# Patient Record
Sex: Male | Born: 1990 | Race: White | Hispanic: No | Marital: Single | Smoking: Current every day smoker
Health system: Southern US, Community
[De-identification: ages and names within clinical notes are randomized; demographics above are authoritative.]

## PROBLEM LIST (undated history)

## (undated) DIAGNOSIS — F1990 Other psychoactive substance use, unspecified, uncomplicated: Secondary | ICD-10-CM

---
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
## 2015-08-14 ENCOUNTER — Encounter (HOSPITAL_COMMUNITY): Payer: Self-pay | Admitting: Emergency Medicine

## 2015-08-14 ENCOUNTER — Emergency Department (HOSPITAL_COMMUNITY)
Admission: EM | Admit: 2015-08-14 | Discharge: 2015-08-14 | Discharge status: Against Medical Advice | Payer: Self-pay | Attending: Emergency Medicine | Admitting: Emergency Medicine

## 2015-08-14 DIAGNOSIS — Z5321 Procedure and treatment not carried out due to patient leaving prior to being seen by health care provider: Secondary | ICD-10-CM | POA: Insufficient documentation

## 2015-08-14 DIAGNOSIS — Y9389 Activity, other specified: Secondary | ICD-10-CM | POA: Insufficient documentation

## 2015-08-14 DIAGNOSIS — F17210 Nicotine dependence, cigarettes, uncomplicated: Secondary | ICD-10-CM | POA: Insufficient documentation

## 2015-08-14 DIAGNOSIS — Y998 Other external cause status: Secondary | ICD-10-CM | POA: Insufficient documentation

## 2015-08-14 DIAGNOSIS — X58XXXA Exposure to other specified factors, initial encounter: Secondary | ICD-10-CM | POA: Insufficient documentation

## 2015-08-14 DIAGNOSIS — Y9289 Other specified places as the place of occurrence of the external cause: Secondary | ICD-10-CM | POA: Insufficient documentation

## 2015-08-14 NOTE — ED Notes (Signed)
Pt wants to leave AMA.  Pt signed understanding that we advised him not to leave without being seen by MD.

## 2015-08-14 NOTE — ED Provider Notes (Signed)
Patient left without being seen, I did not see or evaluate them.   Seth Brooks Seth Steier, MD 08/14/15 [phone]

## 2015-08-14 NOTE — ED Notes (Signed)
Per EMS pt was found in car overdose unknown amount of heroin.  Pt responds to verbal and painful stimuli.  Pt was ambulatory from EMS stretcher to triage in room.

## 2018-01-13 ENCOUNTER — Encounter (HOSPITAL_COMMUNITY): Payer: Self-pay | Admitting: Emergency Medicine

## 2018-01-13 ENCOUNTER — Emergency Department (HOSPITAL_COMMUNITY)
Admission: EM | Admit: 2018-01-13 | Discharge: 2018-01-13 | Disposition: A | Discharge status: Home / Self Care | Payer: Self-pay | Attending: Emergency Medicine | Admitting: Emergency Medicine

## 2018-01-13 DIAGNOSIS — M7989 Other specified soft tissue disorders: Secondary | ICD-10-CM

## 2018-01-13 DIAGNOSIS — R509 Fever, unspecified: Secondary | ICD-10-CM | POA: Insufficient documentation

## 2018-01-13 DIAGNOSIS — R600 Localized edema: Secondary | ICD-10-CM | POA: Insufficient documentation

## 2018-01-13 DIAGNOSIS — F17210 Nicotine dependence, cigarettes, uncomplicated: Secondary | ICD-10-CM | POA: Insufficient documentation

## 2018-01-13 DIAGNOSIS — M79605 Pain in left leg: Secondary | ICD-10-CM

## 2018-01-13 DIAGNOSIS — M79662 Pain in left lower leg: Secondary | ICD-10-CM | POA: Insufficient documentation

## 2018-01-13 LAB — COMPREHENSIVE METABOLIC PANEL
ALT: 21 U/L (ref 17–63)
AST: 31 U/L (ref 15–41)
Albumin: 3.9 g/dL (ref 3.5–5.0)
Alkaline Phosphatase: 67 U/L (ref 38–126)
Anion gap: 9 (ref 5–15)
BUN: 17 mg/dL (ref 6–20)
CO2: 25 mmol/L (ref 22–32)
Calcium: 8.7 mg/dL — ABNORMAL LOW (ref 8.9–10.3)
Chloride: 98 mmol/L — ABNORMAL LOW (ref 101–111)
Creatinine, Ser: 1.21 mg/dL (ref 0.61–1.24)
GFR calc Af Amer: >60 mL/min (ref >60)
GFR calc non Af Amer: >60 mL/min (ref >60)
Glucose, Bld: 106 mg/dL — ABNORMAL HIGH (ref 65–99)
Potassium: 3.8 mmol/L (ref 3.5–5.1)
Sodium: 132 mmol/L — ABNORMAL LOW (ref 135–145)
Total Bilirubin: 0.8 mg/dL (ref 0.3–1.2)
Total Protein: 7.5 g/dL (ref 6.5–8.1)

## 2018-01-13 LAB — CBC WITH DIFFERENTIAL/PLATELET
Basophils Absolute: 0 10*3/uL (ref 0.0–0.1)
Basophils Relative: 0 %
Eosinophils Absolute: 0 10*3/uL (ref 0.0–0.7)
Eosinophils Relative: 0 %
HCT: 37.8 % — ABNORMAL LOW (ref 39.0–52.0)
Hemoglobin: 13 g/dL (ref 13.0–17.0)
Lymphocytes Relative: 11 %
Lymphs Abs: 0.9 10*3/uL (ref 0.7–4.0)
MCH: 28 pg (ref 26.0–34.0)
MCHC: 34.4 g/dL (ref 30.0–36.0)
MCV: 81.5 fL (ref 78.0–100.0)
Monocytes Absolute: 1.2 10*3/uL — ABNORMAL HIGH (ref 0.1–1.0)
Monocytes Relative: 14 %
Neutro Abs: 6.5 10*3/uL (ref 1.7–7.7)
Neutrophils Relative %: 75 %
Platelets: 198 10*3/uL (ref 150–400)
RBC: 4.64 MIL/uL (ref 4.22–5.81)
RDW: 13 % (ref 11.5–15.5)
WBC: 8.7 10*3/uL (ref 4.0–10.5)

## 2018-01-13 LAB — URINALYSIS, ROUTINE W REFLEX MICROSCOPIC
BILIRUBIN URINE: NEGATIVE
Bacteria, UA: NONE SEEN
GLUCOSE, UA: NEGATIVE mg/dL
KETONES UR: NEGATIVE mg/dL
LEUKOCYTES UA: NEGATIVE
Nitrite: NEGATIVE
Protein, ur: NEGATIVE mg/dL
Specific Gravity, Urine: 1.026 (ref 1.005–1.030)
pH: 5 (ref 5.0–8.0)

## 2018-01-13 LAB — I-STAT CG4 LACTIC ACID, ED: Lactic Acid, Venous: 1.07 mmol/L (ref 0.5–1.9)

## 2018-01-13 MED ORDER — SODIUM CHLORIDE 0.9 % IV BOLUS
1000.0000 mL | Freq: Once | INTRAVENOUS | Status: AC
  Administered 2018-01-13: 1000 mL via INTRAVENOUS

## 2018-01-13 MED ORDER — CLINDAMYCIN PHOSPHATE 600 MG/50ML IV SOLN
600.0000 mg | Freq: Once | INTRAVENOUS | Status: AC
  Administered 2018-01-13: 600 mg via INTRAVENOUS
  Filled 2018-01-13: qty 50

## 2018-01-13 MED ORDER — CLINDAMYCIN HCL 150 MG PO CAPS: 450.0000 mg | ORAL_CAPSULE | Freq: Three times a day (TID) | ORAL | 0 refills | Status: DC

## 2018-01-13 MED ORDER — OXYCODONE-ACETAMINOPHEN 5-325 MG PO TABS: 1.0000 | ORAL_TABLET | ORAL | 0 refills | Status: DC | PRN

## 2018-01-13 MED ORDER — CLINDAMYCIN HCL 150 MG PO CAPS: 450.0000 mg | ORAL_CAPSULE | Freq: Three times a day (TID) | ORAL | 0 refills | Status: AC

## 2018-01-13 MED ORDER — ACETAMINOPHEN 325 MG PO TABS
650.0000 mg | ORAL_TABLET | Freq: Once | ORAL | Status: AC | PRN
  Administered 2018-01-13: 650 mg via ORAL
  Filled 2018-01-13: qty 2

## 2018-01-13 MED ORDER — MORPHINE SULFATE (PF) 4 MG/ML IV SOLN
4.0000 mg | Freq: Once | INTRAVENOUS | Status: AC
  Administered 2018-01-13: 4 mg via INTRAVENOUS
  Filled 2018-01-13: qty 1

## 2018-01-13 NOTE — ED Notes (Signed)
RN made aware of abnormal vital signs.  

## 2018-01-13 NOTE — ED Triage Notes (Signed)
Patient c/o redness, warmth, swelling, and pain to left lower extremity since Saturday. Denies wound, injury. Febrile in triage. Pulse present in triage.

## 2018-01-13 NOTE — ED Provider Notes (Addendum)
[facility]-EMERGENCY DEPT Provider Note   CSN: [id number] Arrival date & time: 01/13/18  1745     History   Chief Complaint Chief Complaint  Patient presents with  . Fever  . Leg Pain    HPI Seth Brooks is a 27 y.o. male with no past medical history is here for gradually worsening redness to left lower extremity for 4 days.  Symptoms began in the lower leg and have been spreading up towards the knee.  Associated symptoms include swelling, redness, warmth, pain with ambulation, fevers.  Pain is moderate.  Aggravating factors include putting weight on the leg.  No alleviating factors.  No interventions PTA.  No trauma.  No history of DVT, PE, prolonged travel, recent immobilization, recent surgery, malignancy, estrogen use.  He denies chest pain, shortness of breath, pleuritic chest pain  HPI  History reviewed. No pertinent past medical history.  There are no active problems to display for this patient.   History reviewed. No pertinent surgical history.      Home Medications    Prior to Admission medications   Medication Sig Start Date End Date Taking? Authorizing Provider  clindamycin (CLEOCIN) 150 MG capsule Take 3 capsules (450 mg total) by mouth 3 (three) times daily for 5 days. 01/13/18 01/18/18  Liberty Handy, PA-C  oxyCODONE-acetaminophen (PERCOCET/ROXICET) 5-325 MG tablet Take 1 tablet by mouth every 4 (four) hours as needed for up to 3 days for severe pain. 01/13/18 01/16/18  Liberty Handy, PA-C    Family History No family history on file.  Social History Social History   Tobacco Use  . Smoking status: Light Tobacco Smoker    Types: Cigarettes  Substance Use Topics  . Alcohol use: No  . Drug use: Not on file     Allergies   Patient has no known allergies.   Review of Systems Review of Systems  Constitutional: Positive for fever.  Cardiovascular: Positive for leg swelling.  Skin: Positive for color change.  All other  systems reviewed and are negative.    Physical Exam Updated Vital Signs BP 114/82   Pulse (!) 101   Temp 100.2 F (37.9 C) (Oral)   Resp 14   Ht  (1.854 m)   Wt 95.3 kg (210 lb)   SpO2 99%   BMI 27.71 kg/m   Physical Exam  Constitutional: He is oriented to person, place, and time. He appears well-developed and well-nourished. No distress.  Non toxic.  HENT:  Head: Normocephalic and atraumatic.  Nose: Nose normal.  Mouth/Throat: No oropharyngeal exudate.  Moist mucous membranes   Eyes: Pupils are equal, round, and reactive to light. Conjunctivae and EOM are normal.  Neck: Normal range of motion.  Cardiovascular: Normal rate, regular rhythm, normal heart sounds and intact distal pulses.  No murmur heard. 2+ pitting edema to the left lower extremity up to knee.  Mild tenderness to the left calf.  No edema, tenderness to the right lower extremity.  2+ DP and radial pulses bilaterally.  Pulmonary/Chest: Effort normal and breath sounds normal.  Abdominal: Soft. Bowel sounds are normal. There is no tenderness.  No G/R/R. No suprapubic or CVA tenderness.   Musculoskeletal: Normal range of motion. He exhibits no deformity.  No erythema, edema, warm to joints of the left lower extremity.  He has full active range of motion of these joints without pain.  Neurological: He is alert and oriented to person, place, and time.  Skin: Skin is warm and  dry. Capillary refill takes less than 2 seconds.  Erythema to left lower extremity, most significant above ankle spreading up towards the left knee.  Tender.  No warmth.  There is a tiny linear abrasion to the mid tib/fib.  Psychiatric: He has a normal mood and affect. His behavior is normal. Judgment and thought content normal.  Nursing note and vitals reviewed.        ED Treatments / Results  Labs (all labs ordered are listed, but only abnormal results are displayed) Labs Reviewed  COMPREHENSIVE METABOLIC PANEL - Abnormal; Notable  for the following components:      Result Value   Sodium 132 (*)    Chloride 98 (*)    Glucose, Bld 106 (*)    Calcium 8.7 (*)    All other components within normal limits  CBC WITH DIFFERENTIAL/PLATELET - Abnormal; Notable for the following components:   HCT 37.8 (*)    Monocytes Absolute 1.2 (*)    All other components within normal limits  URINALYSIS, ROUTINE W REFLEX MICROSCOPIC - Abnormal; Notable for the following components:   Hgb urine dipstick SMALL (*)    All other components within normal limits  CULTURE, BLOOD (ROUTINE X 2)  CULTURE, BLOOD (ROUTINE X 2)  I-STAT CG4 LACTIC ACID, ED  I-STAT CG4 LACTIC ACID, ED    EKG EKG Interpretation  Date/Time:  Tuesday January 13 2018 19:10:09 EDT Ventricular Rate:  100 PR Interval:    QRS Duration: 89 QT Interval:  338 QTC Calculation: 436 R Axis:   83 Text Interpretation:  Sinus tachycardia No prior ECG for comparison.  No STEMI Confirmed by Theda Belfast ([id number]) on 01/13/2018 7:52:59 PM   Radiology No results found.  Procedures Procedures (including critical care time)  Medications Ordered in ED Medications  acetaminophen (TYLENOL) tablet 650 mg (650 mg Oral Given 01/13/18 1821)  sodium chloride 0.9 % bolus 1,000 mL (1,000 mLs Intravenous New Bag/Given 01/13/18 1905)  clindamycin (CLEOCIN) IVPB 600 mg (600 mg Intravenous New Bag/Given 01/13/18 1948)  morphine 4 MG/ML injection 4 mg (4 mg Intravenous Given 01/13/18 1922)     Initial Impression / Assessment and Plan / ED Course  I have reviewed the triage vital signs and the nursing notes.  Pertinent labs & imaging results that were available during my care of the patient were reviewed by me and considered in my medical decision making (see chart for details).     Differential diagnosis includes DVT, cellulitis.  Given his fever, appearance on exam cellulitis is favored.  He has no risk factors for DVT.  There are no signs of septic arthritis.  No IVDU. Extremities  neurovascularly intact.  Initial vital signs remarkable for fever and tachycardia, this resolved after antipyretics.  He does not look toxic or septic.  No history of immunocompromise. PE considered given initial tachycardia and fever however he is asymptomatic w/o CP, SOB, tachypnea, hypoxia.   Lab work reviewed and reassuring.  Normal WBC and lactic acid.  Unfortunately, vascular ultrasound is not available in the ED.  Patient is given IV fluids, clindamycin, analgesia in the ED.  And given reassuring exam  Normal labs, immunocompetent status feel patient is adequate for discharge with antibiotics, pain control, crutches.  Although DVT low on differential, will recommend vascular ultrasound tomorrow morning to rule this out.  Order placed for vascular ultrasound at [facility]. Discussed return precautions. Patient verbalized understanding and is in agreement of ED treatment and discharge plan.  Patient discussed with Dr. Rush Landmark.  Final Clinical Impressions(s) / ED Diagnoses   Final diagnoses:  Pain and swelling of lower extremity, left    ED Discharge Orders        Ordered    clindamycin (CLEOCIN) 150 MG capsule  3 times daily     01/13/18 2035    oxyCODONE-acetaminophen (PERCOCET/ROXICET) 5-325 MG tablet  Every 4 hours PRN     01/13/18 2035    LE VENOUS     01/13/18 2039         Liberty Handy, PA-C 01/13/18 2040    Tegeler, Canary Brim, MD 01/14/18 0000

## 2018-01-13 NOTE — ED Notes (Signed)
Bed: WA09 Expected date:  Expected time:  Means of arrival:  Comments: [place]

## 2018-01-13 NOTE — Discharge Instructions (Addendum)
Symptoms most likely from cellulitis which is a superficial skin infection.  A blood clot in your leg it can also present similarly.  We have low suspicion for a blood clot and we will treat this like cellulitis.  However, we recommend you go to [facility] radiology/vascular ultrasound department tomorrow at 8 AM to have a vascular ultrasound of your left lower extremity to rule out a blood clot.  Return for chest pain, shortness of breath, pain in your chest with deep inspiration, palpitations, dizziness, bloody cough.   Take antibiotic as prescribed. For pain can take 1000 mg acetaminophen every 6 hours. Can add 600 mg ibuprofen for more pain control.   IMPORTANT PATIENT INSTRUCTIONS:  You have been scheduled for an Outpatient Vascular Study at [facility].    If tomorrow is a Saturday, Sunday or holiday, please go to the [facility] Emergency Department Registration Desk at 8 am tomorrow morning and tell them you are there for a vascular study.  If tomorrow is a weekday (Monday-Friday), please go to [facility] Admitting Department at 8 am and tell them you are there for a vascular study.

## 2018-01-13 NOTE — ED Notes (Signed)
Ortho tech paged for crutches.  

## 2018-01-14 ENCOUNTER — Ambulatory Visit (HOSPITAL_COMMUNITY): Payer: Self-pay

## 2018-01-14 ENCOUNTER — Ambulatory Visit (HOSPITAL_COMMUNITY)
Admission: RE | Admit: 2018-01-14 | Discharge: 2018-01-14 | Disposition: A | Discharge status: Home / Self Care | Payer: Self-pay | Source: Ambulatory Visit | Attending: Emergency Medicine | Admitting: Emergency Medicine

## 2018-01-14 DIAGNOSIS — M7989 Other specified soft tissue disorders: Secondary | ICD-10-CM | POA: Insufficient documentation

## 2018-01-14 NOTE — Progress Notes (Signed)
Left lower extremity venous duplex has been completed. Negative for DVT.  01/14/18 2:23 PM Olen Cordial RVT

## 2018-01-18 LAB — CULTURE, BLOOD (ROUTINE X 2)
CULTURE: NO GROWTH
Culture: NO GROWTH
SPECIAL REQUESTS: ADEQUATE
SPECIAL REQUESTS: ADEQUATE

## 2019-08-12 ENCOUNTER — Other Ambulatory Visit: Payer: Self-pay

## 2019-08-12 ENCOUNTER — Inpatient Hospital Stay (HOSPITAL_COMMUNITY)
Admission: EM | Admit: 2019-08-12 | Discharge: 2019-08-13 | DRG: 872 | Discharge status: Against Medical Advice | Payer: Self-pay | Attending: Student | Admitting: Student

## 2019-08-12 ENCOUNTER — Encounter (HOSPITAL_COMMUNITY): Payer: Self-pay | Admitting: Emergency Medicine

## 2019-08-12 DIAGNOSIS — A400 Sepsis due to streptococcus, group A: Principal | ICD-10-CM | POA: Diagnosis present

## 2019-08-12 DIAGNOSIS — F1510 Other stimulant abuse, uncomplicated: Secondary | ICD-10-CM | POA: Diagnosis present

## 2019-08-12 DIAGNOSIS — E878 Other disorders of electrolyte and fluid balance, not elsewhere classified: Secondary | ICD-10-CM | POA: Diagnosis present

## 2019-08-12 DIAGNOSIS — L0390 Cellulitis, unspecified: Secondary | ICD-10-CM | POA: Diagnosis present

## 2019-08-12 DIAGNOSIS — E876 Hypokalemia: Secondary | ICD-10-CM | POA: Diagnosis present

## 2019-08-12 DIAGNOSIS — L03116 Cellulitis of left lower limb: Secondary | ICD-10-CM | POA: Diagnosis present

## 2019-08-12 DIAGNOSIS — Z6824 Body mass index (BMI) 24.0-24.9, adult: Secondary | ICD-10-CM

## 2019-08-12 DIAGNOSIS — L03114 Cellulitis of left upper limb: Secondary | ICD-10-CM

## 2019-08-12 DIAGNOSIS — F1110 Opioid abuse, uncomplicated: Secondary | ICD-10-CM | POA: Diagnosis present

## 2019-08-12 DIAGNOSIS — Z590 Homelessness: Secondary | ICD-10-CM

## 2019-08-12 DIAGNOSIS — R6520 Severe sepsis without septic shock: Secondary | ICD-10-CM

## 2019-08-12 DIAGNOSIS — M7989 Other specified soft tissue disorders: Secondary | ICD-10-CM

## 2019-08-12 DIAGNOSIS — E860 Dehydration: Secondary | ICD-10-CM | POA: Diagnosis present

## 2019-08-12 DIAGNOSIS — E871 Hypo-osmolality and hyponatremia: Secondary | ICD-10-CM | POA: Diagnosis present

## 2019-08-12 DIAGNOSIS — F17210 Nicotine dependence, cigarettes, uncomplicated: Secondary | ICD-10-CM | POA: Diagnosis present

## 2019-08-12 DIAGNOSIS — A419 Sepsis, unspecified organism: Secondary | ICD-10-CM | POA: Diagnosis present

## 2019-08-12 DIAGNOSIS — Z5329 Procedure and treatment not carried out because of patient's decision for other reasons: Secondary | ICD-10-CM | POA: Diagnosis not present

## 2019-08-12 DIAGNOSIS — E440 Moderate protein-calorie malnutrition: Secondary | ICD-10-CM

## 2019-08-12 DIAGNOSIS — E861 Hypovolemia: Secondary | ICD-10-CM | POA: Diagnosis present

## 2019-08-12 DIAGNOSIS — Z20828 Contact with and (suspected) exposure to other viral communicable diseases: Secondary | ICD-10-CM | POA: Diagnosis present

## 2019-08-12 HISTORY — DX: Other psychoactive substance use, unspecified, uncomplicated: F19.90

## 2019-08-12 LAB — CBC WITH DIFFERENTIAL/PLATELET
Abs Immature Granulocytes: 0.07 10*3/uL (ref 0.00–0.07)
Basophils Absolute: 0 10*3/uL (ref 0.0–0.1)
Basophils Relative: 0 %
Eosinophils Absolute: 0 10*3/uL (ref 0.0–0.5)
Eosinophils Relative: 0 %
HCT: 40.9 % (ref 39.0–52.0)
Hemoglobin: 13.6 g/dL (ref 13.0–17.0)
Immature Granulocytes: 1 %
Lymphocytes Relative: 6 %
Lymphs Abs: 0.7 10*3/uL (ref 0.7–4.0)
MCH: 25.8 pg — ABNORMAL LOW (ref 26.0–34.0)
MCHC: 33.3 g/dL (ref 30.0–36.0)
MCV: 77.5 fL — ABNORMAL LOW (ref 80.0–100.0)
Monocytes Absolute: 0.9 10*3/uL (ref 0.1–1.0)
Monocytes Relative: 7 %
Neutro Abs: 11.4 10*3/uL — ABNORMAL HIGH (ref 1.7–7.7)
Neutrophils Relative %: 86 %
Platelets: 158 10*3/uL (ref 150–400)
RBC: 5.28 MIL/uL (ref 4.22–5.81)
RDW: 14.4 % (ref 11.5–15.5)
WBC: 13.2 10*3/uL — ABNORMAL HIGH (ref 4.0–10.5)
nRBC: 0 % (ref 0.0–0.2)

## 2019-08-12 LAB — COMPREHENSIVE METABOLIC PANEL
ALT: 18 U/L (ref 0–44)
AST: 28 U/L (ref 15–41)
Albumin: 3.1 g/dL — ABNORMAL LOW (ref 3.5–5.0)
Alkaline Phosphatase: 107 U/L (ref 38–126)
Anion gap: 14 (ref 5–15)
BUN: 8 mg/dL (ref 6–20)
CO2: 22 mmol/L (ref 22–32)
Calcium: 9 mg/dL (ref 8.9–10.3)
Chloride: 89 mmol/L — ABNORMAL LOW (ref 98–111)
Creatinine, Ser: 0.95 mg/dL (ref 0.61–1.24)
GFR calc Af Amer: >60 mL/min (ref >60)
GFR calc non Af Amer: >60 mL/min (ref >60)
Glucose, Bld: 135 mg/dL — ABNORMAL HIGH (ref 70–99)
Potassium: 3.4 mmol/L — ABNORMAL LOW (ref 3.5–5.1)
Sodium: 125 mmol/L — ABNORMAL LOW (ref 135–145)
Total Bilirubin: 1.1 mg/dL (ref 0.3–1.2)
Total Protein: 7.7 g/dL (ref 6.5–8.1)

## 2019-08-12 LAB — LACTIC ACID, PLASMA: Lactic Acid, Venous: 3 mmol/L (ref 0.5–1.9)

## 2019-08-12 MED ORDER — VANCOMYCIN HCL IN DEXTROSE 1-5 GM/200ML-% IV SOLN
1000.0000 mg | Freq: Once | INTRAVENOUS | Status: AC
  Administered 2019-08-13: 01:00:00 1000 mg via INTRAVENOUS
  Filled 2019-08-12: qty 200

## 2019-08-12 MED ORDER — KETOROLAC TROMETHAMINE 30 MG/ML IJ SOLN
30.0000 mg | Freq: Once | INTRAMUSCULAR | Status: AC
Start: 2019-08-13 — End: 2019-08-13
  Administered 2019-08-13: 30 mg via INTRAVENOUS
  Filled 2019-08-12: qty 1

## 2019-08-12 MED ORDER — SODIUM CHLORIDE 0.9 % IV BOLUS
1000.0000 mL | Freq: Once | INTRAVENOUS | Status: AC
  Administered 2019-08-13: 1000 mL via INTRAVENOUS

## 2019-08-12 NOTE — ED Triage Notes (Signed)
Patient presents with left forearm redness/swelling onset this week infected from multiple IV drug sticks , denies fever or chills , respirations unlabored.

## 2019-08-12 NOTE — ED Provider Notes (Signed)
[facility] EMERGENCY DEPARTMENT Provider Note   CSN: [id number] Arrival date & time: 08/12/19  1919     History   Chief Complaint Chief Complaint  Patient presents with  . Left Arm Cellulitis    IV drug user    HPI Seth Brooks is a 28 y.o. male.     HPI  This is a 28 year old male with a history of IV drug abuse who presents with left arm pain and swelling.  Patient reports several day history of left arm pain and swelling.  He rates his pain at 10 out of 10.  He is a current IV drug user and injects heroin mostly in his left arm.  He is not noted any fevers at home.  He is not taking anything for his pain.  Reports pain is much worse with palpation and extension of the arm.  Denies numbness or tingling.  Denies chest pain, shortness of breath, fevers at home.  He states "I think I have a blood infection."  Past Medical History:  Diagnosis Date  . IV drug user     There are no active problems to display for this patient.   History reviewed. No pertinent surgical history.      Home Medications    Prior to Admission medications   Not on File    Family History No family history on file.  Social History Social History   Tobacco Use  . Smoking status: Light Tobacco Smoker    Types: Cigarettes  Substance Use Topics  . Alcohol use: No  . Drug use: Yes    Types: Methamphetamines, IV    Comment: Heroin      Allergies   Patient has no known allergies.   Review of Systems Review of Systems  Constitutional: Negative for fever.  Respiratory: Negative for shortness of breath.   Cardiovascular: Negative for chest pain.  Gastrointestinal: Negative for abdominal pain.  Genitourinary: Negative for dysuria.  Musculoskeletal: Negative for back pain.       Left arm pain  Skin: Positive for color change. Negative for wound.  All other systems reviewed and are negative.    Physical Exam Updated Vital Signs BP 133/86 (BP Location: Right  Arm)   Pulse (!) 120   Temp 97.9 F (36.6 C) (Oral)   Resp 20   SpO2 100%   Physical Exam Vitals signs and nursing note reviewed.  Constitutional:      Appearance: He is well-developed.     Comments: Uncomfortable appearing but nontoxic  HENT:     Head: Normocephalic and atraumatic.     Mouth/Throat:     Mouth: Mucous membranes are moist.  Eyes:     Pupils: Pupils are equal, round, and reactive to light.  Neck:     Musculoskeletal: Neck supple.  Cardiovascular:     Rate and Rhythm: Normal rate and regular rhythm.     Heart sounds: Normal heart sounds.  Pulmonary:     Effort: Pulmonary effort is normal. No respiratory distress.     Breath sounds: Normal breath sounds.  Abdominal:     Palpations: Abdomen is soft.     Tenderness: There is no abdominal tenderness.  Musculoskeletal:     Comments: Left upper extremity in a flexed position, swelling noted of the forearm into the hand, 1+ radial pulse, there is a palpable cord, no fluctuance or localized induration  Skin:    General: Skin is warm and dry.     Findings: Erythema  present.  Neurological:     Mental Status: He is alert and oriented to person, place, and time.  Psychiatric:        Mood and Affect: Mood normal.      ED Treatments / Results  Labs (all labs ordered are listed, but only abnormal results are displayed) Labs Reviewed  CBC WITH DIFFERENTIAL/PLATELET - Abnormal; Notable for the following components:      Result Value   WBC 13.2 (*)    MCV 77.5 (*)    MCH 25.8 (*)    Neutro Abs 11.4 (*)    All other components within normal limits  COMPREHENSIVE METABOLIC PANEL - Abnormal; Notable for the following components:   Sodium 125 (*)    Potassium 3.4 (*)    Chloride 89 (*)    Glucose, Bld 135 (*)    Albumin 3.1 (*)    All other components within normal limits  LACTIC ACID, PLASMA - Abnormal; Notable for the following components:   Lactic Acid, Venous 3.0 (*)    All other components within normal  limits  CULTURE, BLOOD (ROUTINE X 2)  CULTURE, BLOOD (ROUTINE X 2)  SARS CORONAVIRUS 2 (TAT 6-24 HRS)  LACTIC ACID, PLASMA    EKG EKG Interpretation  Date/Time:  Thursday August 12 2019 19:31:28 EST Ventricular Rate:  126 PR Interval:  132 QRS Duration: 94 QT Interval:  296 QTC Calculation: 428 R Axis:   81 Text Interpretation: Sinus tachycardia Right atrial enlargement Borderline ECG Confirmed by Tilden Fossaees, Elizabeth [phone]([id number]) on 08/12/2019 9:29:22 PM   Radiology No results found.  Procedures Procedures (including critical care time)  CRITICAL CARE Performed by: Shon Batonourtney F Horton   Total critical care time: 45 minutes  Critical care time was exclusive of separately billable procedures and treating other patients.  Critical care was necessary to treat or prevent imminent or life-threatening deterioration.  Critical care was time spent personally by me on the following activities: development of treatment plan with patient and/or surrogate as well as nursing, discussions with consultants, evaluation of patient's response to treatment, examination of patient, obtaining history from patient or surrogate, ordering and performing treatments and interventions, ordering and review of laboratory studies, ordering and review of radiographic studies, pulse oximetry and re-evaluation of patient's condition.  Medications Ordered in ED Medications  sodium chloride 0.9 % bolus 1,000 mL (has no administration in time range)  vancomycin (VANCOCIN) IVPB 1000 mg/200 mL premix (has no administration in time range)  ketorolac (TORADOL) 30 MG/ML injection 30 mg (has no administration in time range)     Initial Impression / Assessment and Plan / ED Course  I have reviewed the triage vital signs and the nursing notes.  Pertinent labs & imaging results that were available during my care of the patient were reviewed by me and considered in my medical decision making (see chart for details).         Patient presents with pain and erythema of the left upper extremity.  History of IV drug abuse.  He has at minimum extensive cellulitis.  He is difficult to examine as he is in pain and holding his arm in a flexed position.  At this time I do not appreciate localized fluctuance or induration to suggest abscess although certainly this is a possibility.  We will give him pain medication, fluids, antibiotics.  Lab work-up notable for lactate of 3.0.  White count of 13.2.  Sodium 125.  He appears to be in severe sepsis secondary to cellulitis.  Will admit for antibiotics and further evaluation.  Final Clinical Impressions(s) / ED Diagnoses   Final diagnoses:  Cellulitis of left upper extremity  Severe sepsis [facility])  Hyponatremia    ED Discharge Orders    None       Merryl Hacker, MD 08/13/19 [phone]

## 2019-08-13 ENCOUNTER — Inpatient Hospital Stay (HOSPITAL_COMMUNITY): Payer: Self-pay

## 2019-08-13 ENCOUNTER — Encounter (HOSPITAL_COMMUNITY): Payer: Self-pay | Admitting: Internal Medicine

## 2019-08-13 DIAGNOSIS — E440 Moderate protein-calorie malnutrition: Secondary | ICD-10-CM

## 2019-08-13 DIAGNOSIS — F1920 Other psychoactive substance dependence, uncomplicated: Secondary | ICD-10-CM

## 2019-08-13 DIAGNOSIS — R609 Edema, unspecified: Secondary | ICD-10-CM

## 2019-08-13 DIAGNOSIS — E876 Hypokalemia: Secondary | ICD-10-CM | POA: Diagnosis present

## 2019-08-13 DIAGNOSIS — A419 Sepsis, unspecified organism: Secondary | ICD-10-CM | POA: Diagnosis present

## 2019-08-13 DIAGNOSIS — L03116 Cellulitis of left lower limb: Secondary | ICD-10-CM

## 2019-08-13 DIAGNOSIS — R000 Tachycardia, unspecified: Secondary | ICD-10-CM

## 2019-08-13 DIAGNOSIS — L0390 Cellulitis, unspecified: Secondary | ICD-10-CM | POA: Diagnosis present

## 2019-08-13 DIAGNOSIS — F1120 Opioid dependence, uncomplicated: Secondary | ICD-10-CM

## 2019-08-13 DIAGNOSIS — E871 Hypo-osmolality and hyponatremia: Secondary | ICD-10-CM | POA: Diagnosis present

## 2019-08-13 DIAGNOSIS — L03114 Cellulitis of left upper limb: Secondary | ICD-10-CM

## 2019-08-13 LAB — COMPREHENSIVE METABOLIC PANEL
ALT: 15 U/L (ref 0–44)
AST: 17 U/L (ref 15–41)
Albumin: 2.2 g/dL — ABNORMAL LOW (ref 3.5–5.0)
Alkaline Phosphatase: 79 U/L (ref 38–126)
Anion gap: 12 (ref 5–15)
BUN: 11 mg/dL (ref 6–20)
CO2: 24 mmol/L (ref 22–32)
Calcium: 8.3 mg/dL — ABNORMAL LOW (ref 8.9–10.3)
Chloride: 94 mmol/L — ABNORMAL LOW (ref 98–111)
Creatinine, Ser: 0.77 mg/dL (ref 0.61–1.24)
GFR calc Af Amer: >60 mL/min (ref >60)
GFR calc non Af Amer: >60 mL/min (ref >60)
Glucose, Bld: 99 mg/dL (ref 70–99)
Potassium: 3.3 mmol/L — ABNORMAL LOW (ref 3.5–5.1)
Sodium: 130 mmol/L — ABNORMAL LOW (ref 135–145)
Total Bilirubin: 0.8 mg/dL (ref 0.3–1.2)
Total Protein: 5.9 g/dL — ABNORMAL LOW (ref 6.5–8.1)

## 2019-08-13 LAB — CBC
HCT: 30.8 % — ABNORMAL LOW (ref 39.0–52.0)
Hemoglobin: 10.9 g/dL — ABNORMAL LOW (ref 13.0–17.0)
MCH: 26.1 pg (ref 26.0–34.0)
MCHC: 35.4 g/dL (ref 30.0–36.0)
MCV: 73.7 fL — ABNORMAL LOW (ref 80.0–100.0)
Platelets: 158 10*3/uL (ref 150–400)
RBC: 4.18 MIL/uL — ABNORMAL LOW (ref 4.22–5.81)
RDW: 14.4 % (ref 11.5–15.5)
WBC: 9.5 10*3/uL (ref 4.0–10.5)
nRBC: 0 % (ref 0.0–0.2)

## 2019-08-13 LAB — PHOSPHORUS: Phosphorus: 2.3 mg/dL — ABNORMAL LOW (ref 2.5–4.6)

## 2019-08-13 LAB — RAPID URINE DRUG SCREEN, HOSP PERFORMED
Amphetamines: POSITIVE — AB
Barbiturates: NOT DETECTED
Benzodiazepines: POSITIVE — AB
Cocaine: NOT DETECTED
Opiates: POSITIVE — AB
Tetrahydrocannabinol: POSITIVE — AB

## 2019-08-13 LAB — OSMOLALITY: Osmolality: 261 mOsm/kg — ABNORMAL LOW (ref 275–295)

## 2019-08-13 LAB — ECHOCARDIOGRAM COMPLETE
Height: 72 in
Weight: 2880 oz

## 2019-08-13 LAB — CORTISOL: Cortisol, Plasma: 30.5 ug/dL

## 2019-08-13 LAB — LACTIC ACID, PLASMA: Lactic Acid, Venous: 2.9 mmol/L (ref 0.5–1.9)

## 2019-08-13 LAB — HIV ANTIBODY (ROUTINE TESTING W REFLEX): HIV Screen 4th Generation wRfx: REACTIVE — AB

## 2019-08-13 LAB — SODIUM, URINE, RANDOM: Sodium, Ur: <10 mmol/L

## 2019-08-13 LAB — SARS CORONAVIRUS 2 (TAT 6-24 HRS): SARS Coronavirus 2: NEGATIVE

## 2019-08-13 LAB — TSH: TSH: 1.698 u[IU]/mL (ref 0.350–4.500)

## 2019-08-13 LAB — OSMOLALITY, URINE: Osmolality, Ur: 423 mOsm/kg (ref 300–900)

## 2019-08-13 MED ORDER — VANCOMYCIN HCL 10 G IV SOLR
1750.0000 mg | Freq: Two times a day (BID) | INTRAVENOUS | Status: DC
  Administered 2019-08-13: 1750 mg via INTRAVENOUS
  Filled 2019-08-13 (×3): qty 1750

## 2019-08-13 MED ORDER — ENOXAPARIN SODIUM 40 MG/0.4ML ~~LOC~~ SOLN
40.0000 mg | SUBCUTANEOUS | Status: DC
  Filled 2019-08-13: qty 0.4

## 2019-08-13 MED ORDER — LORAZEPAM 2 MG/ML IJ SOLN: 1.0000 mg | INTRAMUSCULAR | Status: DC | PRN

## 2019-08-13 MED ORDER — POTASSIUM CHLORIDE IN NACL 20-0.9 MEQ/L-% IV SOLN
INTRAVENOUS | Status: DC
  Administered 2019-08-13: 11:00:00 via INTRAVENOUS
  Filled 2019-08-13 (×2): qty 1000

## 2019-08-13 MED ORDER — MORPHINE SULFATE (PF) 2 MG/ML IV SOLN
2.0000 mg | INTRAVENOUS | Status: DC | PRN
  Administered 2019-08-13 (×2): 2 mg via INTRAVENOUS
  Filled 2019-08-13 (×2): qty 1

## 2019-08-13 MED ORDER — FOLIC ACID 1 MG PO TABS
1.0000 mg | ORAL_TABLET | Freq: Every day | ORAL | Status: DC
  Administered 2019-08-13: 1 mg via ORAL
  Filled 2019-08-13: qty 1

## 2019-08-13 MED ORDER — SODIUM CHLORIDE 0.9 % IV SOLN
INTRAVENOUS | Status: DC
  Administered 2019-08-13: 03:00:00 via INTRAVENOUS

## 2019-08-13 MED ORDER — OXYCODONE HCL 5 MG PO TABS
5.0000 mg | ORAL_TABLET | ORAL | Status: DC | PRN
  Administered 2019-08-13 (×2): 10 mg via ORAL
  Filled 2019-08-13 (×2): qty 2

## 2019-08-13 MED ORDER — ACETAMINOPHEN 325 MG PO TABS
650.0000 mg | ORAL_TABLET | Freq: Four times a day (QID) | ORAL | Status: DC | PRN
  Administered 2019-08-13: 650 mg via ORAL
  Filled 2019-08-13: qty 2

## 2019-08-13 MED ORDER — VITAMIN B-1 100 MG PO TABS
100.0000 mg | ORAL_TABLET | Freq: Every day | ORAL | Status: DC
  Administered 2019-08-13: 100 mg via ORAL
  Filled 2019-08-13: qty 1

## 2019-08-13 MED ORDER — LORAZEPAM 1 MG PO TABS
1.0000 mg | ORAL_TABLET | ORAL | Status: DC | PRN
  Administered 2019-08-13: 1 mg via ORAL
  Filled 2019-08-13: qty 1

## 2019-08-13 MED ORDER — VANCOMYCIN HCL 10 G IV SOLR
1500.0000 mg | Freq: Two times a day (BID) | INTRAVENOUS | Status: DC
  Administered 2019-08-13: 1500 mg via INTRAVENOUS
  Filled 2019-08-13 (×2): qty 1500

## 2019-08-13 MED ORDER — POTASSIUM CHLORIDE CRYS ER 20 MEQ PO TBCR
40.0000 meq | EXTENDED_RELEASE_TABLET | Freq: Once | ORAL | Status: AC
  Administered 2019-08-13: 40 meq via ORAL
  Filled 2019-08-13: qty 2

## 2019-08-13 MED ORDER — THIAMINE HCL 100 MG/ML IJ SOLN: 100.0000 mg | Freq: Every day | INTRAMUSCULAR | Status: DC

## 2019-08-13 MED ORDER — ACETAMINOPHEN 650 MG RE SUPP: 650.0000 mg | Freq: Four times a day (QID) | RECTAL | Status: DC | PRN

## 2019-08-13 MED ORDER — POTASSIUM CHLORIDE CRYS ER 20 MEQ PO TBCR
20.0000 meq | EXTENDED_RELEASE_TABLET | Freq: Once | ORAL | Status: DC
  Filled 2019-08-13: qty 1

## 2019-08-13 MED ORDER — ADULT MULTIVITAMIN W/MINERALS CH
1.0000 | ORAL_TABLET | Freq: Every day | ORAL | Status: DC
  Administered 2019-08-13: 1 via ORAL
  Filled 2019-08-13: qty 1

## 2019-08-13 MED ORDER — SODIUM CHLORIDE 0.9 % IV SOLN
2.0000 g | Freq: Once | INTRAVENOUS | Status: AC
  Administered 2019-08-13: 2 g via INTRAVENOUS
  Filled 2019-08-13: qty 2

## 2019-08-13 MED ORDER — SODIUM CHLORIDE 0.9 % IV SOLN
2.0000 g | Freq: Three times a day (TID) | INTRAVENOUS | Status: DC
  Administered 2019-08-13 (×2): 2 g via INTRAVENOUS
  Filled 2019-08-13 (×5): qty 2

## 2019-08-13 NOTE — Progress Notes (Signed)
PROGRESS NOTE  Seth Brooks VFM:[id number] DOB: 1991/02/23   PCP: Patient, No Pcp Per  Patient is from: Homeless  DOA: 08/12/2019 LOS: 0  Brief Narrative / Interim history: 28 year old male with history of IV drug use and homelessness presenting with left forearm redness and swelling for 3 to 4 days.  He denies trauma or injury.  Admitted for sepsis due to left arm cellulitis.  Started on IV vancomycin and cefepime.  In ED, met sepsis criteria with leukocytosis, tachycardia, lactic acidosis and known source of infection.  Significant labs include WBC 13.2, sodium 125, K3.4, lactic acid 3.0.    The next day, noted to have left foot swelling and erythema concerning for cellulitis.   Subjective: Patient is sleepy and not engaging to provide good history.  He says he is homeless.  He says he had left arm and left foot pain for 3 to 4 days.  He denies trauma or injury.  He admits to injecting heroin and amphetamine.  Also admits to taking Xanax.  He denies cocaine.  Objective: Vitals:   08/13/19 0530 08/13/19 0615 08/13/19 0700 08/13/19 0747  BP: 123/88 119/80 125/82   Pulse:   81 88  Resp: '17 16 12 14  ' Temp:   98 F (36.7 C)   TempSrc:   Oral   SpO2:   100% 100%  Weight:      Height:       No intake or output data in the 24 hours ending 08/13/19 1008 Filed Weights   08/13/19 0200  Weight: 81.6 kg    Examination:  GENERAL: Sleepy and groggy but rises to voice.  Answer some questions. HEENT: MMM.  Vision and hearing grossly intact.  NECK: Supple.  No apparent JVD.  RESP:  No IWOB. Good air movement bilaterally. CVS:  RRR.  No murmurs.  Heart sounds normal.  Palpable distal pulses. ABD/GI/GU: Bowel sounds present. Soft. Non tender.  MSK/EXT: Left forearm and left foot swelling and tenderness out of proportion.  Notable erythema over left foot. SKIN: Notable erythema over left foot. NEURO: Sleepy and groggy but rises to voice easily.  No apparent focal neuro deficit. PSYCH:  Sleepy and groggy.  Easily agitated with palpation of left arm and left foot.  Assessment & Plan: Sepsis due to left forearm and left foot cellulitis and patient with history of IVDU: tachycardia and tachypnea resolved.  No stigmata of endocarditis.  Low suspicion for compartment syndrome.  Patient was not able to tolerate vascular ultrasound of left upper extremity due to pain.  Echocardiogram without significant finding or mention of vegetation. -Continue vancomycin and cefepime -Obtain left forearm and foot x-ray -Follow cultures and inflammatory markers. -Trend leukocytosis and lactic acid. -Will consult surgery orthopedic surgery after x-rays.  -Pain control.  Hypochloremic hypotonic hyponatremia: Likely hypovolemic.  TSH and cortisol level within normal range. -Continue normal saline  Hypokalemia -Recheck potassium and replenish as appropriate  Lactic acidosis: Likely a combination of sepsis and dehydration. -Recheck -Antibiotics as above  Leukocytosis: Likely due to infection -Continue trending  History of IV drug abuse: admits to injecting heroin and amphetamine.  Also uses benzo. -Initiate CIWA protocol. -Check UDS               DVT prophylaxis: Subcu Lovenox Code Status: Full code Family Communication: Patient and/or RN. Available if any question. Disposition Plan: Remains inpatient Consultants: None  Procedures:  None  Microbiology summarized: [id number] screen negative Blood culture pending  Sch Meds:  Scheduled Meds:  enoxaparin (  LOVENOX) injection  40 mg Subcutaneous Q24H   potassium chloride  20 mEq Oral Once   Continuous Infusions:  sodium chloride 50 mL/hr at 08/13/19 0304   ceFEPime (MAXIPIME) IV 2 g (08/13/19 0908)   vancomycin Stopped (08/13/19 0720)   PRN Meds:.acetaminophen **OR** acetaminophen  Antimicrobials: Anti-infectives (From admission, onward)   Start     Dose/Rate Route Frequency Ordered Stop   08/13/19 0800  ceFEPIme  (MAXIPIME) 2 g in sodium chloride 0.9 % 100 mL IVPB     2 g 200 mL/hr over 30 Minutes Intravenous Every 8 hours 08/13/19 0302     08/13/19 0400  vancomycin (VANCOCIN) 1,500 mg in sodium chloride 0.9 % 500 mL IVPB     1,500 mg 250 mL/hr over 120 Minutes Intravenous Every 12 hours 08/13/19 0302     08/13/19 0115  ceFEPIme (MAXIPIME) 2 g in sodium chloride 0.9 % 100 mL IVPB     2 g 200 mL/hr over 30 Minutes Intravenous  Once 08/13/19 0103 08/13/19 0344   08/13/19 0000  vancomycin (VANCOCIN) IVPB 1000 mg/200 mL premix     1,000 mg 200 mL/hr over 60 Minutes Intravenous  Once 08/12/19 2351 08/13/19 0205       I have personally reviewed the following labs and images: CBC: Recent Labs  Lab 08/12/19 1955  WBC 13.2*  NEUTROABS 11.4*  HGB 13.6  HCT 40.9  MCV 77.5*  PLT 158   BMP &GFR Recent Labs  Lab 08/12/19 1955  NA 125*  K 3.4*  CL 89*  CO2 22  GLUCOSE 135*  BUN 8  CREATININE 0.95  CALCIUM 9.0   Estimated Creatinine Clearance: 125.9 mL/min (by C-G formula based on SCr of 0.95 mg/dL). Liver & Pancreas: Recent Labs  Lab 08/12/19 1955  AST 28  ALT 18  ALKPHOS 107  BILITOT 1.1  PROT 7.7  ALBUMIN 3.1*   No results for input(s): LIPASE, AMYLASE in the last 168 hours. No results for input(s): AMMONIA in the last 168 hours. Diabetic: No results for input(s): HGBA1C in the last 72 hours. No results for input(s): GLUCAP in the last 168 hours. Cardiac Enzymes: No results for input(s): CKTOTAL, CKMB, CKMBINDEX, TROPONINI in the last 168 hours. No results for input(s): PROBNP in the last 8760 hours. Coagulation Profile: No results for input(s): INR, PROTIME in the last 168 hours. Thyroid Function Tests: Recent Labs    08/13/19 0052  TSH 1.698   Lipid Profile: No results for input(s): CHOL, HDL, LDLCALC, TRIG, CHOLHDL, LDLDIRECT in the last 72 hours. Anemia Panel: No results for input(s): VITAMINB12, FOLATE, FERRITIN, TIBC, IRON, RETICCTPCT in the last 72  hours. Urine analysis:    Component Value Date/Time   COLORURINE YELLOW 01/13/2018 1904   APPEARANCEUR CLEAR 01/13/2018 1904   LABSPEC 1.026 01/13/2018 1904   PHURINE 5.0 01/13/2018 1904   GLUCOSEU NEGATIVE 01/13/2018 1904   HGBUR SMALL (A) 01/13/2018 1904   BILIRUBINUR NEGATIVE 01/13/2018 1904   KETONESUR NEGATIVE 01/13/2018 1904   PROTEINUR NEGATIVE 01/13/2018 1904   NITRITE NEGATIVE 01/13/2018 1904   LEUKOCYTESUR NEGATIVE 01/13/2018 1904   Sepsis Labs: Invalid input(s): PROCALCITONIN, [place]  Microbiology: Recent Results (from the past 240 hour(s))  SARS CORONAVIRUS 2 (TAT 6-24 HRS) Nasopharyngeal Nasopharyngeal Swab     Status: None   Collection Time: 08/13/19 12:27 AM   Specimen: Nasopharyngeal Swab  Result Value Ref Range Status   SARS Coronavirus 2 NEGATIVE NEGATIVE Final    Comment: (NOTE) SARS-CoV-2 target nucleic acids are NOT DETECTED.  The SARS-CoV-2 RNA is generally detectable in upper and lower respiratory specimens during the acute phase of infection. Negative results do not preclude SARS-CoV-2 infection, do not rule out co-infections with other pathogens, and should not be used as the sole basis for treatment or other patient management decisions. Negative results must be combined with clinical observations, patient history, and epidemiological information. The expected result is Negative. Fact Sheet for Patients: SugarRoll.be Fact Sheet for Healthcare Providers: https://www.woods-mathews.com/ This test is not yet approved or cleared by the [place] FDA and  has been authorized for detection and/or diagnosis of SARS-CoV-2 by FDA under an Emergency Use Authorization (EUA). This EUA will remain  in effect (meaning this test can be used) for the duration of the COVID-19 declaration under Section 56 4(b)(1) of the Act, 21 U.S.C. section 360bbb-3(b)(1), unless the authorization is terminated or revoked  sooner. Performed at [facility], [place] [address] [place], [place] [address]     Radiology Studies: Vas [place] Upper Extremity Venous Duplex  Result Date: 08/13/2019 UPPER VENOUS STUDY  Indications: Edema, and Pain Risk Factors: IV drug abuse. Anticoagulation: Lovenox. Performing Technologist: Darlina Sicilian RDCS  Examination Guidelines: A complete evaluation includes B-mode imaging, spectral Doppler, color Doppler, and power Doppler as needed of all accessible portions of each vessel. Bilateral testing is considered an integral part of a complete examination. Limited examinations for reoccurring indications may be performed as noted.  Right Findings: +----------+------------+---------+-----------+----------+---------------------+  RIGHT      Compressible Phasicity Spontaneous Properties        Summary         +----------+------------+---------+-----------+----------+---------------------+  IJV            Full        Yes        Yes                                       +----------+------------+---------+-----------+----------+---------------------+  Subclavian     Full        Yes        Yes                                       +----------+------------+---------+-----------+----------+---------------------+  Axillary                   Yes        Yes                                       +----------+------------+---------+-----------+----------+---------------------+  Brachial                   Yes        Yes                                       +----------+------------+---------+-----------+----------+---------------------+  Radial                     Yes        Yes                only proximal portion  visualized        +----------+------------+---------+-----------+----------+---------------------+  Ulnar                                                       Not visualized       +----------+------------+---------+-----------+----------+---------------------+  Cephalic                                                    Not visualized      +----------+------------+---------+-----------+----------+---------------------+  Basilic                                                     Not visualized      +----------+------------+---------+-----------+----------+---------------------+  Left Findings: +----+------------+---------+-----------+----------+-------+  LEFT Compressible Phasicity Spontaneous Properties Summary  +----+------------+---------+-----------+----------+-------+  IJV      Full        Yes        Yes                         +----+------------+---------+-----------+----------+-------+  Summary:  Right: This was a limited study. Left arm pain unable to perform compressions. Very limited exam patient unable to turn his left arm.  *See table(s) above for measurements and observations.    Preliminary     Tobin Cadiente T. [place]  If 7PM-7AM, please contact night-coverage [email or web address] Password [email or web address] 08/13/2019, 10:08 AM

## 2019-08-13 NOTE — Progress Notes (Signed)
Limited left upper extremity venou doppler completed results are located under CV Proc.   Darlina Sicilian RDCS

## 2019-08-13 NOTE — Progress Notes (Signed)
Pharmacy Antibiotic Note  Seth Brooks is a 28 y.o. male admitted on 08/12/2019 with sepsis secondary to cellulitis. Pharmacy has been consulted for vancomcyin and cefepime dosing.  Renal function is improving, afebrile, WBC WNL, LA down to 2.9.  Plan: Increase vanc to 1750mg  IV Q12H for AUC 497 using SCr 0.8 Continue cefepime 2gm IV Q8H Monitor renal fxn, clinical progress, vanc AUC as indicated F/u narrow abx   Height: 6' (182.9 cm) Weight: 180 lb (81.6 kg)(estimate, please weigh when admitted) IBW/kg (Calculated) : 77.6  Temp (24hrs), Avg:98 F (36.7 C), Min:97.9 F (36.6 C), Max:98 F (36.7 C)  Recent Labs  Lab 08/12/19 1955 08/12/19 1958 08/13/19 0027 08/13/19 1121  WBC 13.2*  --   --  9.5  CREATININE 0.95  --   --  0.77  LATICACIDVEN  --  3.0* 2.9*  --     Estimated Creatinine Clearance: 149.5 mL/min (by C-G formula based on SCr of 0.77 mg/dL).    No Known Allergies  Vanc 11/27 >> Cefepime 11/27 >>  11/27 covid - negative 11/27 BCx - NGTD  [name] D. Mina Marble, PharmD, BCPS, [place] 08/13/2019, 1:05 PM

## 2019-08-13 NOTE — H&P (Addendum)
TRH H&P    Patient Demographics:    Seth Brooks, is a 28 y.o. male  MRN: [id number]  DOB - [birth year]  Admit Date - 08/12/2019  Referring MD/NP/PA: Dina Brooks  Outpatient Primary MD for the patient is Seth Brooks  Patient coming from:  home  Chief complaint- cellulitis   HPI:    Seth Brooks  is a 28 y.o. male,  w IVDA apparently presents with left forearm redness and swelling.   In ED,  T 97.9, P 126, R 16, Bp 136/89 pox 100% onRA  Wbc 13.2, Hgb 13.6, Plt 158 Na 125, K 3.4, Bun 8, Creatinine 0.95 Ast 28, Alt 18, Alb 3.1 Lactic acid 3.0 Blood culture x2 pending  Pt given vanco iv in ED  Pt will be admitted for sepsis (leukocytosis, tachycardia, elevated lactic acid), secondary to cellulitis left forearm      Review of systems:    In addition to the HPI above,  No Fever-chills, No Headache, No changes with Vision or hearing, No problems swallowing food or Liquids, No Chest pain, Cough or Shortness of Breath, No Abdominal pain, No Nausea or Vomiting, bowel movements are regular, No Blood in stool or Urine, No dysuria,   No new joints pains-aches,  No new weakness, tingling, numbness in any extremity, No recent weight gain or loss, No polyuria, polydypsia or polyphagia, No significant Mental Stressors.  All other systems reviewed and are negative.    Past History of the following :    Past Medical History:  Diagnosis Date  . IV drug user       History reviewed. No pertinent surgical history.    Social History:      Social History   Tobacco Use  . Smoking status: Light Tobacco Smoker    Types: Cigarettes  . Smokeless tobacco: Never Used  Substance Use Topics  . Alcohol use: No       Family History :     Family History  Problem Relation Age of Onset  . Cancer Father        Home Medications:   Prior to Admission medications   Not on File     Allergies:    No Known Allergies   Physical Exam:   Vitals  Blood pressure (!) 138/97, pulse (!) 102, temperature 97.9 F (36.6 C), temperature source Oral, resp. rate 17, SpO2 98 %.  1.  General: axoxo3  2. Psychiatric: euthymic  3. Neurologic: nonfocal  4. HEENMT:  Anicteric,  Pupils 1.38m symmetric, direct, consensual, intact Neck: no jvd  5. Respiratory : CTAB on anterior ausculation  6. Cardiovascular : Tachy s1, s2, 1/6 sem apex  7. Gastrointestinal:  Abd: soft, nt, nd, +bs  8. Skin:  Lower Ext: no c/c/e,  Swelling of left forearm, with redness from the wrist to the elbow No janeway, no osler, no splinter on the right hand, pt won't let me look at his left palm  9.Musculoskeletal:  Good ROM    Data Review:    CBC Recent Labs  Lab  08/12/19 1955  WBC 13.2*  HGB 13.6  HCT 40.9  PLT 158  MCV 77.5*  MCH 25.8*  MCHC 33.3  RDW 14.4  LYMPHSABS 0.7  MONOABS 0.9  EOSABS 0.0  BASOSABS 0.0   ------------------------------------------------------------------------------------------------------------------  Results for orders placed or performed during the hospital encounter of 08/12/19 (from the past 48 hour(s))  CBC with Differential     Status: Abnormal   Collection Time: 08/12/19  7:55 PM  Result Value Ref Range   WBC 13.2 (H) 4.0 - 10.5 K/uL   RBC 5.28 4.22 - 5.81 MIL/uL   Hemoglobin 13.6 13.0 - 17.0 g/dL   HCT 40.9 39.0 - 52.0 %   MCV 77.5 (L) 80.0 - 100.0 fL   MCH 25.8 (L) 26.0 - 34.0 pg   MCHC 33.3 30.0 - 36.0 g/dL   RDW 14.4 11.5 - 15.5 %   Platelets 158 150 - 400 K/uL    Comment: REPEATED TO VERIFY   nRBC 0.0 0.0 - 0.2 %   Neutrophils Relative % 86 %   Neutro Abs 11.4 (H) 1.7 - 7.7 K/uL   Lymphocytes Relative 6 %   Lymphs Abs 0.7 0.7 - 4.0 K/uL   Monocytes Relative 7 %   Monocytes Absolute 0.9 0.1 - 1.0 K/uL   Eosinophils Relative 0 %   Eosinophils Absolute 0.0 0.0 - 0.5 K/uL   Basophils Relative 0 %   Basophils Absolute 0.0 0.0  - 0.1 K/uL   Immature Granulocytes 1 %   Abs Immature Granulocytes 0.07 0.00 - 0.07 K/uL    Comment: Performed at [facility], [address] [place], [place] [address]  Comprehensive metabolic panel     Status: Abnormal   Collection Time: 08/12/19  7:55 PM  Result Value Ref Range   Sodium 125 (L) 135 - 145 mmol/L   Potassium 3.4 (L) 3.5 - 5.1 mmol/L   Chloride 89 (L) 98 - 111 mmol/L   CO2 22 22 - 32 mmol/L   Glucose, Bld 135 (H) 70 - 99 mg/dL   BUN 8 6 - 20 mg/dL   Creatinine, Ser 0.95 0.61 - 1.24 mg/dL   Calcium 9.0 8.9 - 10.3 mg/dL   Total Protein 7.7 6.5 - 8.1 g/dL   Albumin 3.1 (L) 3.5 - 5.0 g/dL   AST 28 15 - 41 U/L   ALT 18 0 - 44 U/L   Alkaline Phosphatase 107 38 - 126 U/L   Total Bilirubin 1.1 0.3 - 1.2 mg/dL   GFR calc non Af Amer >60 >60 mL/min   GFR calc Af Amer >60 >60 mL/min   Anion gap 14 5 - 15    Comment: Performed at [facility], [place] [address] [place], [place] [address]  Lactic acid, plasma     Status: Abnormal   Collection Time: 08/12/19  7:58 PM  Result Value Ref Range   Lactic Acid, Venous 3.0 (HH) 0.5 - 1.9 mmol/L    Comment: CRITICAL RESULT CALLED TO, READ BACK BY AND VERIFIED WITH: RN Seth Brooks AT 2027 08/12/2019 BY L BENFIELD Performed at [facility], [place] [address] [place], [place] [address]     Chemistries  Recent Labs  Lab 08/12/19 1955  NA 125*  K 3.4*  CL 89*  CO2 22  GLUCOSE 135*  BUN 8  CREATININE 0.95  CALCIUM 9.0  AST 28  ALT 18  ALKPHOS 107  BILITOT 1.1   ------------------------------------------------------------------------------------------------------------------  ------------------------------------------------------------------------------------------------------------------ GFR: CrCl cannot be calculated (  Unknown ideal weight.). Liver Function Tests: Recent Labs  Lab 08/12/19 1955  AST 28  ALT 18  ALKPHOS 107  BILITOT 1.1  PROT 7.7  ALBUMIN 3.1*   No results for input(s): LIPASE,  AMYLASE in the last 168 hours. No results for input(s): AMMONIA in the last 168 hours. Coagulation Profile: No results for input(s): INR, PROTIME in the last 168 hours. Cardiac Enzymes: No results for input(s): CKTOTAL, CKMB, CKMBINDEX, TROPONINI in the last 168 hours. BNP (last 3 results) No results for input(s): PROBNP in the last 8760 hours. HbA1C: No results for input(s): HGBA1C in the last 72 hours. CBG: No results for input(s): GLUCAP in the last 168 hours. Lipid Profile: No results for input(s): CHOL, HDL, LDLCALC, TRIG, CHOLHDL, LDLDIRECT in the last 72 hours. Thyroid Function Tests: No results for input(s): TSH, T4TOTAL, FREET4, T3FREE, THYROIDAB in the last 72 hours. Anemia Panel: No results for input(s): VITAMINB12, FOLATE, FERRITIN, TIBC, IRON, RETICCTPCT in the last 72 hours.  --------------------------------------------------------------------------------------------------------------- Urine analysis:    Component Value Date/Time   COLORURINE YELLOW 01/13/2018 1904   APPEARANCEUR CLEAR 01/13/2018 1904   LABSPEC 1.026 01/13/2018 1904   PHURINE 5.0 01/13/2018 1904   GLUCOSEU NEGATIVE 01/13/2018 1904   HGBUR SMALL (A) 01/13/2018 1904   BILIRUBINUR NEGATIVE 01/13/2018 1904   KETONESUR NEGATIVE 01/13/2018 1904   PROTEINUR NEGATIVE 01/13/2018 1904   NITRITE NEGATIVE 01/13/2018 1904   LEUKOCYTESUR NEGATIVE 01/13/2018 1904      Imaging Results:    No results found. ST at 125, nl axis, nl int, q in 2,3,avf, v4-6 +lvh   Assessment & Plan:    Principal Problem:   Cellulitis Active Problems:   Hyponatremia   Hypokalemia   Protein-calorie malnutrition, moderate (HCC)   Sepsis (HCC) Sepsis (leukocytosis, tachycardia, elevated lactic acid) Cellulitis Blood  Culture x2 Check ESR vanco iv, cefepime iv pharmacy to dose  Tachycardia Tele Trop I  Check tsh Check cardiac echo  Left forearm swelling Ultrasound r/o DVT Consider orthopedic evaluation of forearm   Hyponatremia Check serum osm, cortisol, tsh Check urine sodium, urine osm Hydrate with ns at 50 mL Brooks hour  Check cmp in am  Hypokalemia Replete Check cmp in am  Hx of IVDA Recommended cessation    DVT Prophylaxis-   Lovenox - SCDs   AM Labs Ordered, also please review Full Orders  Family Communication: Admission, patients condition and plan of care including tests being ordered have been discussed with the patient who indicate understanding and agree with the plan and Code Status.  Code Status:  FULL CODE Brooks patient  Admission status:  Inpatient: Based on patients clinical presentation and evaluation of above clinical data, I have made determination that patient meets Inpatient criteria at this time.  Pt has early sepsis and will require iv abx, and has high risk of clinical deterioration.  Pt will require > 2 nites stay.   Time spent in minutes : 55 minutes   Jani Gravel M.D on 08/13/2019 at 1:20 AM

## 2019-08-13 NOTE — Progress Notes (Signed)
  Echocardiogram 2D Echocardiogram has been performed.  Seth Brooks 08/13/2019, 8:41 AM

## 2019-08-13 NOTE — Progress Notes (Signed)
Pharmacy Antibiotic Note  Seth Brooks is a 28 y.o. male admitted on 08/12/2019 with sepsis secondary to cellulitis.  Pharmacy has been consulted for vancomcyin and cefepime dosing.  Plan: Vancomycin 1000mg  given in ED then 1500mg  IV Q12H. Goal AUC 400-550.  Expected AUC 470.  SCr used 0.95.  Cefepime 2g IV Q8H.  Height: 6' (182.9 cm) Weight: 180 lb (81.6 kg)(estimate, please weigh when admitted) IBW/kg (Calculated) : 77.6  Temp (24hrs), Avg:97.9 F (36.6 C), Min:97.9 F (36.6 C), Max:97.9 F (36.6 C)  Recent Labs  Lab 08/12/19 1955 08/12/19 1958 08/13/19 0027  WBC 13.2*  --   --   CREATININE 0.95  --   --   LATICACIDVEN  --  3.0* 2.9*    Estimated Creatinine Clearance: 125.9 mL/min (by C-G formula based on SCr of 0.95 mg/dL).    No Known Allergies   Thank you for allowing pharmacy to be a part of this patient's care.  Wynona Neat, PharmD, BCPS  08/13/2019 3:02 AM

## 2019-08-13 NOTE — Progress Notes (Signed)
PHARMACY - PHYSICIAN COMMUNICATION CRITICAL VALUE ALERT - BLOOD CULTURE IDENTIFICATION (BCID)  Seth Brooks is an 28 y.o. male who presented to [facility] on 08/12/2019 with a chief complaint of left forarm redness/swelling  Assessment:  One set of blood cultures positive for GPC in chains.  No BCID done yet.  Presumed source is left arm cellulitis.  Awaiting ID and sensitivities  Name of physician (or Provider) Contacted: Dr. Cyndia Skeeters  Current antibiotics: Vancomycin and cefepime  Changes to prescribed antibiotics recommended: None at this time.   No results found for this or any previous visit.  [name] 08/13/2019  4:39 PM

## 2019-08-13 NOTE — Progress Notes (Signed)
CSW attempted to meet with the patient at bedside. Patient stated he wasn't feeling well. CSW left resources at bedside. CSW asked him to page the nurse if he had any questions or needed additional resources.   CSW left homeless resources and substance abuse resources at bedside.   CSW will continue to follow and assist as needed.   Domenic Schwab, MSW, [place] Worker [facility]  [phone]

## 2019-08-13 NOTE — Progress Notes (Signed)
Pt called me to the room stated he was leaving against medical advice. I explained to pt the the need for him to stay and get antibiotics and that him leaving prematurely  Could worsen his situation. patient stated that he didn't care and he was leaving. Pt alert and oriented,  NP paged to notify, piv removed AMA paper signed pt left floor own his own

## 2019-08-14 ENCOUNTER — Other Ambulatory Visit: Payer: Self-pay | Admitting: Student

## 2019-08-14 DIAGNOSIS — L03116 Cellulitis of left lower limb: Secondary | ICD-10-CM

## 2019-08-14 MED ORDER — DOXYCYCLINE HYCLATE 100 MG PO CAPS: 100.0000 mg | ORAL_CAPSULE | Freq: Two times a day (BID) | ORAL | 0 refills | Status: DC

## 2019-08-14 NOTE — Discharge Summary (Signed)
Physician Discharge Summary  Seth Brooks UXL:[id number] DOB: Jan 16, 1991 DOA: 08/12/2019  PCP: Patient, No Pcp Per  Admit date: 08/12/2019 Discharge date: 08/14/2019  Admitted From: Homeless Disposition: Left AMA overnight.  Hospital Course: 28 year old male with history of IV drug use and homelessness presenting with left forearm redness and swelling for 3 to 4 days.  He denies trauma or injury.  Admitted for sepsis due to left arm cellulitis.  Started on IV vancomycin and cefepime.  In ED, met sepsis criteria with leukocytosis, tachycardia, lactic acidosis and known source of infection.  Significant labs include WBC 13.2, sodium 125, K3.4, lactic acid 3.0.   The next day, noted to have left foot swelling and erythema in addition to left forearm swelling and cellulitis.  Left forearm and left foot x-ray findings suggestive for cellulitis but no osseous abnormalities noted. Patient continued on IV vancomycin and cefepime.   Patient left AGAINST MEDICAL ADVICE the night of 08/13/2019.  Blood cultures are Streptococcus pyogenes in 1 out of 2 bottles.  I have attempted to call patient using the phone number listed in his chart but his phone number does not seem to be working.  I also called and talked to his stepmother, Seth Brooks who is listed as emergency contact.  She is aware of his hospitalization and AMA but doesn't know where he stays and how to get in touch with him. Called his significant other, Seth Brooks who gave him his phone number, [phone].  When I called this number, he answered hello and hung up the phone.  I sent prescription for doxycycline to his pharmacy with a request to the pharmacy to call patient and let him know when the prescription is ready.   Discharge Diagnoses:  Sepsis due to left forearm and left foot cellulitis and patient with history of IVDU: tachycardia and tachypnea resolved.  No stigmata of endocarditis.  Low suspicion for compartment syndrome.  Patient was  not able to tolerate vascular ultrasound of left upper extremity due to pain.  Echocardiogram without significant finding or mention of vegetation.  X-ray without osseous abnormalities.  Blood culture with Streptococcus pyogenes in 1 out of 2 bottles.  Received vancomycin and cefepime 11/26-11/27.  He left AGAINST MEDICAL ADVICE the night of 08/13/2019.  Hypochloremic hypotonic hyponatremia: Likely hypovolemic.  TSH and cortisol level within normal range.  Improved with IV normal saline.  Hypokalemia: Replenished with K. Dur.  Lactic acidosis: Likely a combination of sepsis and dehydration.  Leukocytosis: Likely due to infection.  Resolved.  Normocytic anemia: H&H stable.  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  UDS positive for opiates, amphetamine, THC and benzo.  CSW consulted and provided with resources.  Homelessness: CSW consulted and provided patient with resources.  HIV screen: Reactive.  Reflex confirmatory test pending.  Discharge Instructions   Allergies as of 08/13/2019   No Known Allergies     Medication List    You have not been prescribed any medications.     Consultations:  None  Procedures/Studies:  2D Echo on 08/13/2019  1. Left ventricular ejection fraction, by visual estimation, is 50 to 55%. The left ventricle has normal function.  2. Global right ventricle has normal systolic function.The right ventricular size is normal.  3. Left atrial size was normal.  4. Right atrial size was normal.  5. The mitral valve is normal in structure. Trace mitral valve regurgitation. No evidence of mitral stenosis.  6. The tricuspid valve is normal in structure. Tricuspid  valve regurgitation is trivial.  7. The aortic valve is tricuspid. Aortic valve regurgitation is not visualized. No evidence of aortic valve sclerosis or stenosis.  8. The pulmonic valve was normal in structure. Pulmonic valve regurgitation is not visualized.  9.  The inferior vena cava is normal in size with greater than 50% respiratory variability, suggesting right atrial pressure of 3 mmHg. 10. Normal LV function; trace MR and TR.  Dg Forearm Left  Result Date: 08/13/2019 CLINICAL DATA:  Swelling of the left forearm. IV drug user. EXAM: LEFT FOREARM - 2 VIEW COMPARISON:  None. FINDINGS: The bones appear normal. Diffuse soft tissue swelling of the proximal forearm. No visible gas in the soft tissues. IMPRESSION: Soft tissue swelling. Otherwise, normal exam. No visible gas in the soft tissues. Electronically Signed   By: Lorriane Shire M.D.   On: 08/13/2019 10:59   Dg Foot Complete Left  Result Date: 08/13/2019 CLINICAL DATA:  Left foot swelling EXAM: LEFT FOOT - COMPLETE 3+ VIEW COMPARISON:  None. FINDINGS: No fracture or dislocation of the left foot. Joint spaces are well preserved. Diffuse soft tissue edema about the foot, particularly the dorsum. IMPRESSION: No acute bony abnormality. Soft tissue edema about the foot, particularly the dorsum of the foot. Electronically Signed   By: Eddie Candle M.D.   On: 08/13/2019 11:01   Vas [place] Upper Extremity Venous Duplex  Result Date: 08/14/2019 UPPER VENOUS STUDY  Indications: Edema, and Pain Risk Factors: IV drug abuse. Anticoagulation: Lovenox. Performing Technologist: Darlina Sicilian RDCS  Examination Guidelines: A complete evaluation includes B-mode imaging, spectral Doppler, color Doppler, and power Doppler as needed of all accessible portions of each vessel. Bilateral testing is considered an integral part of a complete examination. Limited examinations for reoccurring indications may be performed as noted.  Right Findings: +----------+------------+---------+-----------+----------+---------------------+  RIGHT      Compressible Phasicity Spontaneous Properties        Summary         +----------+------------+---------+-----------+----------+---------------------+  IJV            Full        Yes        Yes                                        +----------+------------+---------+-----------+----------+---------------------+  Subclavian     Full        Yes        Yes                                       +----------+------------+---------+-----------+----------+---------------------+  Axillary                   Yes        Yes                                       +----------+------------+---------+-----------+----------+---------------------+  Brachial                   Yes        Yes                                       +----------+------------+---------+-----------+----------+---------------------+  Radial                     Yes        Yes                only proximal portion                                                                 visualized        +----------+------------+---------+-----------+----------+---------------------+  Ulnar                                                       Not visualized      +----------+------------+---------+-----------+----------+---------------------+  Cephalic                                                    Not visualized      +----------+------------+---------+-----------+----------+---------------------+  Basilic                                                     Not visualized      +----------+------------+---------+-----------+----------+---------------------+  Left Findings: +----+------------+---------+-----------+----------+-------+  LEFT Compressible Phasicity Spontaneous Properties Summary  +----+------------+---------+-----------+----------+-------+  IJV      Full        Yes        Yes                         +----+------------+---------+-----------+----------+-------+  Summary:  Right: This was a limited study. Left arm pain unable to perform compressions. Very limited exam patient unable to turn his left arm.  *See table(s) above for measurements and observations.  Diagnosing physician: Curt Jews MD Electronically signed by Curt Jews MD on 08/14/2019 at  9:27:40 AM.    Final        Discharge Exam: Vitals:   08/13/19 1657 08/13/19 2001  BP:  138/84  Pulse:  95  Resp:  13  Temp: 98.7 F (37.1 C) 98.3 F (36.8 C)  SpO2:  100%    Patient left AGAINST MEDICAL ADVICE overnight and was not examined   The results of significant diagnostics from this hospitalization (including imaging, microbiology, ancillary and laboratory) are listed below for reference.     Microbiology: Recent Results (from the past 240 hour(s))  Blood culture (routine x 2)     Status: None (Preliminary result)   Collection Time: 08/12/19  7:54 PM   Specimen: BLOOD RIGHT ARM  Result Value Ref Range Status   Specimen Description BLOOD RIGHT ARM  Final   Special Requests   Final    AEROBIC BOTTLE ONLY Blood Culture results may not be optimal due to an inadequate volume of blood received in culture bottles   Culture   Final    NO GROWTH 2  DAYS Performed at [facility], [place] [address], [place], [place] [address]    Report Status PENDING  Incomplete  Blood culture (routine x 2)     Status: Abnormal (Preliminary result)   Collection Time: 08/13/19 12:10 AM   Specimen: BLOOD RIGHT HAND  Result Value Ref Range Status   Specimen Description BLOOD RIGHT HAND  Final   Special Requests   Final    BOTTLES DRAWN AEROBIC AND ANAEROBIC Blood Culture adequate volume   Culture  Setup Time   Final    GRAM POSITIVE COCCI IN CHAINS IN BOTH AEROBIC AND ANAEROBIC BOTTLES CRITICAL RESULT CALLED TO, READ BACK BY AND VERIFIED WITH: PHARMD JEREMY F. [id number] FCP    Culture (A)  Final    STREPTOCOCCUS PYOGENES SUSCEPTIBILITIES TO FOLLOW Performed at [facility], [place] [address], [place], [place] [address]    Report Status PENDING  Incomplete  SARS CORONAVIRUS 2 (TAT 6-24 HRS) Nasopharyngeal Nasopharyngeal Swab     Status: None   Collection Time: 08/13/19 12:27 AM   Specimen: Nasopharyngeal Swab  Result Value Ref Range Status   SARS Coronavirus 2 NEGATIVE  NEGATIVE Final    Comment: (NOTE) SARS-CoV-2 target nucleic acids are NOT DETECTED. The SARS-CoV-2 RNA is generally detectable in upper and lower respiratory specimens during the acute phase of infection. Negative results do not preclude SARS-CoV-2 infection, do not rule out co-infections with other pathogens, and should not be used as the sole basis for treatment or other patient management decisions. Negative results must be combined with clinical observations, patient history, and epidemiological information. The expected result is Negative. Fact Sheet for Patients: SugarRoll.be Fact Sheet for Healthcare Providers: https://www.woods-mathews.com/ This test is not yet approved or cleared by the [place] FDA and  has been authorized for detection and/or diagnosis of SARS-CoV-2 by FDA under an Emergency Use Authorization (EUA). This EUA will remain  in effect (meaning this test can be used) for the duration of the COVID-19 declaration under Section 56 4(b)(1) of the Act, 21 U.S.C. section 360bbb-3(b)(1), unless the authorization is terminated or revoked sooner. Performed at [facility], [place] [address], [place], [place] [address]      Labs: BNP (last 3 results) No results for input(s): BNP in the last 8760 hours. Basic Metabolic Panel: Recent Labs  Lab 08/12/19 1955 08/13/19 1121  NA 125* 130*  K 3.4* 3.3*  CL 89* 94*  CO2 22 24  GLUCOSE 135* 99  BUN 8 11  CREATININE 0.95 0.77  CALCIUM 9.0 8.3*  PHOS  --  2.3*   Liver Function Tests: Recent Labs  Lab 08/12/19 1955 08/13/19 1121  AST 28 17  ALT 18 15  ALKPHOS 107 79  BILITOT 1.1 0.8  PROT 7.7 5.9*  ALBUMIN 3.1* 2.2*   No results for input(s): LIPASE, AMYLASE in the last 168 hours. No results for input(s): AMMONIA in the last 168 hours. CBC: Recent Labs  Lab 08/12/19 1955 08/13/19 1121  WBC 13.2* 9.5  NEUTROABS 11.4*  --   HGB 13.6 10.9*  HCT 40.9  30.8*  MCV 77.5* 73.7*  PLT 158 158   Cardiac Enzymes: No results for input(s): CKTOTAL, CKMB, CKMBINDEX, TROPONINI in the last 168 hours. BNP: Invalid input(s): POCBNP CBG: No results for input(s): GLUCAP in the last 168 hours. D-Dimer No results for input(s): DDIMER in the last 72 hours. Hgb A1c No results for input(s): HGBA1C in the last 72 hours. Lipid Profile No results for  input(s): CHOL, HDL, LDLCALC, TRIG, CHOLHDL, LDLDIRECT in the last 72 hours. Thyroid function studies Recent Labs    08/13/19 0052  TSH 1.698   Anemia work up No results for input(s): VITAMINB12, FOLATE, FERRITIN, TIBC, IRON, RETICCTPCT in the last 72 hours. Urinalysis    Component Value Date/Time   COLORURINE YELLOW 01/13/2018 1904   APPEARANCEUR CLEAR 01/13/2018 1904   LABSPEC 1.026 01/13/2018 1904   PHURINE 5.0 01/13/2018 1904   GLUCOSEU NEGATIVE 01/13/2018 1904   HGBUR SMALL (A) 01/13/2018 1904   BILIRUBINUR NEGATIVE 01/13/2018 1904   KETONESUR NEGATIVE 01/13/2018 1904   PROTEINUR NEGATIVE 01/13/2018 1904   NITRITE NEGATIVE 01/13/2018 1904   LEUKOCYTESUR NEGATIVE 01/13/2018 1904   Sepsis Labs Invalid input(s): PROCALCITONIN,  WBC,  LACTICIDVEN  Time coordinating discharge: 25 minutes  SIGNED:  Mercy Riding, MD  [facility] 08/14/2019, 5:45 PM  If 7PM-7AM, please contact night-coverage [email or web address] Password [email or web address]

## 2019-08-14 NOTE — Progress Notes (Signed)
Sent Rx for doxycycline 100 mg twice daily for 10 days to patient's pharmacy with patient's new phone number. Requested pharmacy to substitute with Keflex 500 mg 4 times daily x10 days if doxycycline expensive.

## 2019-08-16 ENCOUNTER — Telehealth: Payer: Self-pay | Admitting: Student

## 2019-08-16 LAB — CULTURE, BLOOD (ROUTINE X 2): Special Requests: ADEQUATE

## 2019-08-16 LAB — PANEL 083904
HIV 1 AB: POSITIVE — AB
HIV 2 AB: NEGATIVE

## 2019-08-16 NOTE — Telephone Encounter (Signed)
Blood culture positive for Streptococcus pyogenes.  HIV results positive.  Discussed with infectious disease, Dr. Baxter Flattery over the phone.  Per Dr. Baxter Flattery patient should return to emergency department right away for his bacteremia.  I have already made multiple attempts to call patient when he left AMA.  Home phone not working.  I have attempted to call his cell phone again today but no answer.  He has no voicemail.  Of note, stepmother who was listed as emergency contact is not on with him.  Significant other no longer talks to him.  ID will contact health department.

## 2019-08-17 LAB — CULTURE, BLOOD (ROUTINE X 2): Culture: NO GROWTH

## 2019-08-18 ENCOUNTER — Emergency Department (HOSPITAL_BASED_OUTPATIENT_CLINIC_OR_DEPARTMENT_OTHER): Payer: Self-pay

## 2019-08-18 ENCOUNTER — Inpatient Hospital Stay (HOSPITAL_BASED_OUTPATIENT_CLINIC_OR_DEPARTMENT_OTHER)
Admission: EM | Admit: 2019-08-18 | Discharge: 2019-08-19 | DRG: 872 | Discharge status: Against Medical Advice | Payer: Self-pay | Attending: Internal Medicine | Admitting: Internal Medicine

## 2019-08-18 ENCOUNTER — Other Ambulatory Visit: Payer: Self-pay

## 2019-08-18 ENCOUNTER — Encounter (HOSPITAL_BASED_OUTPATIENT_CLINIC_OR_DEPARTMENT_OTHER): Payer: Self-pay

## 2019-08-18 DIAGNOSIS — M25473 Effusion, unspecified ankle: Secondary | ICD-10-CM

## 2019-08-18 DIAGNOSIS — F17210 Nicotine dependence, cigarettes, uncomplicated: Secondary | ICD-10-CM | POA: Diagnosis present

## 2019-08-18 DIAGNOSIS — Z21 Asymptomatic human immunodeficiency virus [HIV] infection status: Secondary | ICD-10-CM

## 2019-08-18 DIAGNOSIS — Z20828 Contact with and (suspected) exposure to other viral communicable diseases: Secondary | ICD-10-CM | POA: Diagnosis present

## 2019-08-18 DIAGNOSIS — F1110 Opioid abuse, uncomplicated: Secondary | ICD-10-CM | POA: Diagnosis present

## 2019-08-18 DIAGNOSIS — R7881 Bacteremia: Principal | ICD-10-CM | POA: Diagnosis present

## 2019-08-18 DIAGNOSIS — F419 Anxiety disorder, unspecified: Secondary | ICD-10-CM | POA: Diagnosis present

## 2019-08-18 DIAGNOSIS — B20 Human immunodeficiency virus [HIV] disease: Secondary | ICD-10-CM | POA: Diagnosis present

## 2019-08-18 DIAGNOSIS — B955 Unspecified streptococcus as the cause of diseases classified elsewhere: Secondary | ICD-10-CM | POA: Diagnosis present

## 2019-08-18 DIAGNOSIS — L03114 Cellulitis of left upper limb: Secondary | ICD-10-CM | POA: Diagnosis present

## 2019-08-18 DIAGNOSIS — L0390 Cellulitis, unspecified: Secondary | ICD-10-CM | POA: Diagnosis present

## 2019-08-18 DIAGNOSIS — B951 Streptococcus, group B, as the cause of diseases classified elsewhere: Secondary | ICD-10-CM | POA: Diagnosis present

## 2019-08-18 DIAGNOSIS — Z590 Homelessness: Secondary | ICD-10-CM

## 2019-08-18 DIAGNOSIS — Z23 Encounter for immunization: Secondary | ICD-10-CM

## 2019-08-18 DIAGNOSIS — R000 Tachycardia, unspecified: Secondary | ICD-10-CM | POA: Diagnosis present

## 2019-08-18 LAB — CBC WITH DIFFERENTIAL/PLATELET
Abs Immature Granulocytes: 0.06 10*3/uL (ref 0.00–0.07)
Basophils Absolute: 0 10*3/uL (ref 0.0–0.1)
Basophils Relative: 0 %
Eosinophils Absolute: 0 10*3/uL (ref 0.0–0.5)
Eosinophils Relative: 0 %
HCT: 38.4 % — ABNORMAL LOW (ref 39.0–52.0)
Hemoglobin: 12.4 g/dL — ABNORMAL LOW (ref 13.0–17.0)
Immature Granulocytes: 1 %
Lymphocytes Relative: 10 %
Lymphs Abs: 0.7 10*3/uL (ref 0.7–4.0)
MCH: 25.2 pg — ABNORMAL LOW (ref 26.0–34.0)
MCHC: 32.3 g/dL (ref 30.0–36.0)
MCV: 78 fL — ABNORMAL LOW (ref 80.0–100.0)
Monocytes Absolute: 0.6 10*3/uL (ref 0.1–1.0)
Monocytes Relative: 8 %
Neutro Abs: 5.8 10*3/uL (ref 1.7–7.7)
Neutrophils Relative %: 81 %
Platelets: 512 10*3/uL — ABNORMAL HIGH (ref 150–400)
RBC: 4.92 MIL/uL (ref 4.22–5.81)
RDW: 14.6 % (ref 11.5–15.5)
WBC: 7.2 10*3/uL (ref 4.0–10.5)
nRBC: 0 % (ref 0.0–0.2)

## 2019-08-18 LAB — COMPREHENSIVE METABOLIC PANEL
ALT: 41 U/L (ref 0–44)
AST: 24 U/L (ref 15–41)
Albumin: 2.8 g/dL — ABNORMAL LOW (ref 3.5–5.0)
Alkaline Phosphatase: 105 U/L (ref 38–126)
Anion gap: 9 (ref 5–15)
BUN: 9 mg/dL (ref 6–20)
CO2: 23 mmol/L (ref 22–32)
Calcium: 8.8 mg/dL — ABNORMAL LOW (ref 8.9–10.3)
Chloride: 102 mmol/L (ref 98–111)
Creatinine, Ser: 0.65 mg/dL (ref 0.61–1.24)
GFR calc Af Amer: >60 mL/min (ref >60)
GFR calc non Af Amer: >60 mL/min (ref >60)
Glucose, Bld: 116 mg/dL — ABNORMAL HIGH (ref 70–99)
Potassium: 4.2 mmol/L (ref 3.5–5.1)
Sodium: 134 mmol/L — ABNORMAL LOW (ref 135–145)
Total Bilirubin: 0.4 mg/dL (ref 0.3–1.2)
Total Protein: 8.2 g/dL — ABNORMAL HIGH (ref 6.5–8.1)

## 2019-08-18 LAB — URINALYSIS, ROUTINE W REFLEX MICROSCOPIC
Bilirubin Urine: NEGATIVE
Glucose, UA: NEGATIVE mg/dL
Hgb urine dipstick: NEGATIVE
Ketones, ur: NEGATIVE mg/dL
Leukocytes,Ua: NEGATIVE
Nitrite: NEGATIVE
Protein, ur: NEGATIVE mg/dL
Specific Gravity, Urine: 1.025 (ref 1.005–1.030)
pH: 6.5 (ref 5.0–8.0)

## 2019-08-18 LAB — SARS CORONAVIRUS 2 (TAT 6-24 HRS): SARS Coronavirus 2: NEGATIVE

## 2019-08-18 LAB — PROTIME-INR
INR: 1.1 (ref 0.8–1.2)
Prothrombin Time: 13.7 seconds (ref 11.4–15.2)

## 2019-08-18 LAB — APTT: aPTT: 32 seconds (ref 24–36)

## 2019-08-18 LAB — LACTIC ACID, PLASMA: Lactic Acid, Venous: 0.9 mmol/L (ref 0.5–1.9)

## 2019-08-18 MED ORDER — CLONIDINE HCL 0.1 MG PO TABS: 0.1000 mg | ORAL_TABLET | Freq: Two times a day (BID) | ORAL | Status: DC

## 2019-08-18 MED ORDER — VANCOMYCIN HCL IN DEXTROSE 1-5 GM/200ML-% IV SOLN
1000.0000 mg | INTRAVENOUS | Status: AC
  Administered 2019-08-18 (×2): 1000 mg via INTRAVENOUS
  Filled 2019-08-18: qty 200

## 2019-08-18 MED ORDER — CLONIDINE HCL 0.1 MG PO TABS
0.1000 mg | ORAL_TABLET | Freq: Four times a day (QID) | ORAL | Status: DC
  Administered 2019-08-18 – 2019-08-19 (×5): 0.1 mg via ORAL
  Filled 2019-08-18 (×5): qty 1

## 2019-08-18 MED ORDER — SODIUM CHLORIDE 0.9 % IV BOLUS
1000.0000 mL | Freq: Once | INTRAVENOUS | Status: AC
  Administered 2019-08-18: 1000 mL via INTRAVENOUS

## 2019-08-18 MED ORDER — PIPERACILLIN-TAZOBACTAM 3.375 G IVPB
3.3750 g | Freq: Three times a day (TID) | INTRAVENOUS | Status: DC
  Administered 2019-08-19 (×2): 3.375 g via INTRAVENOUS
  Filled 2019-08-18 (×2): qty 50

## 2019-08-18 MED ORDER — CLONIDINE HCL 0.1 MG PO TABS: 0.1000 mg | ORAL_TABLET | Freq: Every day | ORAL | Status: DC

## 2019-08-18 MED ORDER — ONDANSETRON 4 MG PO TBDP
4.0000 mg | ORAL_TABLET | Freq: Four times a day (QID) | ORAL | Status: DC | PRN
  Administered 2019-08-19 (×2): 4 mg via ORAL
  Filled 2019-08-18 (×2): qty 1

## 2019-08-18 MED ORDER — HYDROXYZINE HCL 25 MG PO TABS
25.0000 mg | ORAL_TABLET | Freq: Four times a day (QID) | ORAL | Status: DC | PRN
  Administered 2019-08-19 (×2): 25 mg via ORAL
  Filled 2019-08-18 (×2): qty 1

## 2019-08-18 MED ORDER — METHOCARBAMOL 500 MG PO TABS: 500.0000 mg | ORAL_TABLET | Freq: Three times a day (TID) | ORAL | Status: DC | PRN

## 2019-08-18 MED ORDER — NAPROXEN 250 MG PO TABS
500.0000 mg | ORAL_TABLET | Freq: Two times a day (BID) | ORAL | Status: DC | PRN
  Filled 2019-08-18: qty 2

## 2019-08-18 MED ORDER — LOPERAMIDE HCL 2 MG PO CAPS
2.0000 mg | ORAL_CAPSULE | ORAL | Status: DC | PRN
  Administered 2019-08-19: 2 mg via ORAL
  Filled 2019-08-18: qty 1

## 2019-08-18 MED ORDER — PIPERACILLIN-TAZOBACTAM 3.375 G IVPB 30 MIN
3.3750 g | Freq: Once | INTRAVENOUS | Status: AC
  Administered 2019-08-18: 3.375 g via INTRAVENOUS
  Filled 2019-08-18 (×2): qty 50

## 2019-08-18 MED ORDER — VANCOMYCIN HCL 10 G IV SOLR
1750.0000 mg | Freq: Two times a day (BID) | INTRAVENOUS | Status: DC
  Administered 2019-08-19: 1750 mg via INTRAVENOUS
  Filled 2019-08-18 (×2): qty 1750

## 2019-08-18 MED ORDER — DICYCLOMINE HCL 20 MG PO TABS
20.0000 mg | ORAL_TABLET | Freq: Four times a day (QID) | ORAL | Status: DC | PRN
  Filled 2019-08-18: qty 1

## 2019-08-18 MED ORDER — VANCOMYCIN HCL IN DEXTROSE 1-5 GM/200ML-% IV SOLN: 1000.0000 mg | Freq: Once | INTRAVENOUS | Status: DC

## 2019-08-18 NOTE — Progress Notes (Signed)
Pharmacy Antibiotic Note  Seth Brooks is a 28 y.o. male admitted on 08/18/2019 with sepsis.  Pharmacy has been consulted for vancomycin & Zosyn dosing. Patient with recent hospital stay for same issue.  Plan: Vancomycin 2 gram first dose then 1750 IV every 12 hours.  Goal - AUC ~ 500 Zosyn 3.375g IV q8h (4 hour infusion).  Height: 6' (182.9 cm) Weight: 170 lb (77.1 kg) IBW/kg (Calculated) : 77.6  Temp (24hrs), Avg:98.5 F (36.9 C), Min:98.5 F (36.9 C), Max:98.5 F (36.9 C)  Recent Labs  Lab 08/12/19 1955 08/12/19 1958 08/13/19 0027 08/13/19 1121  WBC 13.2*  --   --  9.5  CREATININE 0.95  --   --  0.77  LATICACIDVEN  --  3.0* 2.9*  --     Estimated Creatinine Clearance: 148.6 mL/min (by C-G formula based on SCr of 0.77 mg/dL).    No Known Allergies  Antimicrobials this admission: Vancomycin 1 gram x 2 doses  Zosyn 3.375 gm once  Microbiology results:  pending  Thank you for allowing pharmacy to be a part of this patient's care.  Mallie Mussel A Vishnu Moeller 08/18/2019 2:26 PM

## 2019-08-18 NOTE — ED Triage Notes (Addendum)
Pt c/o swelling to left UE-feels r/t to IV drug use prior to swelling-also c/o pain/swelling to left foot/ankle-denies IV drug use to left LE area-was seen at [facility] ED 11/26 and LWBS stating "they did not ever do anything for me"-chart reads pt was admitted with [facility] 11/27-when asked about admn and AMA pt denies-NAD-slow steady gait

## 2019-08-18 NOTE — ED Provider Notes (Addendum)
[facility] EMERGENCY DEPARTMENT Provider Note   CSN: [id number] Arrival date & time: 08/18/19  1254     History   Chief Complaint Chief Complaint  Patient presents with  . Arm Problem  . Foot Pain    HPI Seth Brooks is a 28 y.o. male presenting for evaluation of continued foot and arm pain and concern for blood infection.  Patient states he was in the hospital last week for a skin infection.  He was told that this infection has spread to his blood, and that he should return for evaluation and treatment.  Patient states he did not pick up any prescriptions at the pharmacy, as such as last dose of antibiotics with vancomycin on 11/27.  Patient states he has been feeling very tired for the past couple days.  He has continued pain of his left forearm and left foot.  He denies known fevers.  He reports nausea without vomiting.  He states he is having some intermittent chest pain, but thinks this is due to anxiety.  He denies abnormal bowel movements or urination. No cough.  He has no other medical problems, takes no medications daily.  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  Additional history obtained from admission from 11/26-11/27.  At that time, patient was found to be septic due to a cellulitic source.  No drainable abscess at that time.  Echo was negative for vegetation.  Blood cultures grew strep pyogenes in 1/2 bottles. Additionally, pt was found to be HIV positive at his last admission.      HPI  Past Medical History:  Diagnosis Date  . IV drug user     Patient Active Problem List   Diagnosis Date Noted  . Cellulitis 08/13/2019  . Hyponatremia 08/13/2019  . Hypokalemia 08/13/2019  . Protein-calorie malnutrition, moderate (HCC) 08/13/2019  . Sepsis (HCC) 08/13/2019    History reviewed. No pertinent surgical history.      Home Medications    Prior to Admission medications   Medication Sig Start Date End Date Taking? Authorizing  Provider  doxycycline (VIBRAMYCIN) 100 MG capsule Take 1 capsule (100 mg total) by mouth 2 (two) times daily. 08/14/19   Almon Hercules, MD    Family History Family History  Problem Relation Age of Onset  . Cancer Father     Social History Social History   Tobacco Use  . Smoking status: Current Every Day Smoker    Types: Cigarettes  . Smokeless tobacco: Never Used  Substance Use Topics  . Alcohol use: No  . Drug use: Yes    Types: IV     Allergies   Patient has no known allergies.   Review of Systems Review of Systems  Skin: Positive for color change.  Neurological: Positive for weakness.  Psychiatric/Behavioral: The patient is nervous/anxious.   All other systems reviewed and are negative.    Physical Exam Updated Vital Signs BP (!) 151/99 (BP Location: Right Arm)   Pulse (!) 112   Temp 98.5 F (36.9 C) (Oral)   Resp 16   Ht 6' (1.829 m)   Wt 77.1 kg   SpO2 100%   BMI 23.06 kg/m   Physical Exam Vitals signs and nursing note reviewed.  Constitutional:      Comments: Slight sweating noted on exam  HENT:     Head: Normocephalic and atraumatic.  Eyes:     Extraocular Movements: Extraocular movements intact.     Conjunctiva/sclera:  Conjunctivae normal.     Pupils: Pupils are equal, round, and reactive to light.  Neck:     Musculoskeletal: Normal range of motion and neck supple.  Cardiovascular:     Rate and Rhythm: Regular rhythm. Tachycardia present.     Pulses: Normal pulses.     Comments: Tachycardic around 120 Pulmonary:     Effort: Pulmonary effort is normal. No respiratory distress.     Breath sounds: Normal breath sounds. No wheezing.  Abdominal:     General: There is no distension.     Palpations: Abdomen is soft. There is no mass.     Tenderness: There is no abdominal tenderness. There is no guarding or rebound.  Musculoskeletal: Normal range of motion.  Skin:    General: Skin is warm and dry.     Capillary Refill: Capillary refill takes  less than 2 seconds.     Findings: Erythema present.     Comments: Erythema and warmth of the left forearm and hand.  Erythema and warmth of the left dorsal foot.  Tenderness of both locations.  Pedal and radial pulses 2+ bilaterally.  No area of fluctuance to indicate abscess.  Neurological:     Mental Status: He is oriented to person, place, and time.      ED Treatments / Results  Labs (all labs ordered are listed, but only abnormal results are displayed) Labs Reviewed  COMPREHENSIVE METABOLIC PANEL - Abnormal; Notable for the following components:      Result Value   Sodium 134 (*)    Glucose, Bld 116 (*)    Calcium 8.8 (*)    Total Protein 8.2 (*)    Albumin 2.8 (*)    All other components within normal limits  CBC WITH DIFFERENTIAL/PLATELET - Abnormal; Notable for the following components:   Hemoglobin 12.4 (*)    HCT 38.4 (*)    MCV 78.0 (*)    MCH 25.2 (*)    Platelets 512 (*)    All other components within normal limits  CULTURE, BLOOD (ROUTINE X 2)  CULTURE, BLOOD (ROUTINE X 2)  URINE CULTURE  SARS CORONAVIRUS 2 (TAT 6-24 HRS)  LACTIC ACID, PLASMA  APTT  PROTIME-INR  LACTIC ACID, PLASMA  URINALYSIS, ROUTINE W REFLEX MICROSCOPIC  T-HELPER CELLS (CD4) COUNT (NOT AT [facility])    EKG EKG Interpretation  Date/Time:  Wednesday August 18 2019 14:27:05 EST Ventricular Rate:  116 PR Interval:    QRS Duration: 93 QT Interval:  314 QTC Calculation: 437 R Axis:   84 Text Interpretation: Sinus tachycardia Right atrial enlargement Baseline wander in lead(s) I III aVL Confirmed by Virgina NorfolkAdam, [place] [phone]) on 08/18/2019 3:15:15 PM   Radiology Dg Chest Portable 1 View  Result Date: 08/18/2019 CLINICAL DATA:  Sepsis. EXAM: PORTABLE CHEST 1 VIEW COMPARISON:  None. FINDINGS: The heart size and mediastinal contours are within normal limits. Both lungs are clear. The visualized skeletal structures are unremarkable. IMPRESSION: No active disease. Electronically Signed   By: Lupita RaiderJames   Green Jr M.D.   On: 08/18/2019 15:45    Procedures .Critical Care Performed by: Alveria Apleyaccavale, Aryiah Monterosso, PA-C Authorized by: Alveria Apleyaccavale, Chester Romero, PA-C   Critical care provider statement:    Critical care time (minutes):  35   Critical care time was exclusive of:  Separately billable procedures and treating other patients and teaching time   Critical care was necessary to treat or prevent imminent or life-threatening deterioration of the following conditions:  Sepsis   Critical care was time spent  personally by me on the following activities:  Blood draw for specimens, development of treatment plan with patient or surrogate, evaluation of patient's response to treatment, examination of patient, obtaining history from patient or surrogate, ordering and performing treatments and interventions, ordering and review of laboratory studies, ordering and review of radiographic studies, pulse oximetry, re-evaluation of patient's condition and review of old charts   I assumed direction of critical care for this patient from another provider in my specialty: no   Comments:     Pt with known bacteremia that is untreated, tachycardic on arrival. Concern for sepsis. IV abx started and pt to be admitted.    (including critical care time)  Medications Ordered in ED Medications  vancomycin (VANCOCIN) IVPB 1000 mg/200 mL premix (1,000 mg Intravenous New Bag/Given 08/18/19 1535)  vancomycin (VANCOCIN) 1,750 mg in sodium chloride 0.9 % 500 mL IVPB (has no administration in time range)  piperacillin-tazobactam (ZOSYN) IVPB 3.375 g (has no administration in time range)  cloNIDine (CATAPRES) tablet 0.1 mg (has no administration in time range)    Followed by  cloNIDine (CATAPRES) tablet 0.1 mg (has no administration in time range)    Followed by  cloNIDine (CATAPRES) tablet 0.1 mg (has no administration in time range)  dicyclomine (BENTYL) tablet 20 mg (has no administration in time range)  hydrOXYzine  (ATARAX/VISTARIL) tablet 25 mg (has no administration in time range)  loperamide (IMODIUM) capsule 2-4 mg (has no administration in time range)  methocarbamol (ROBAXIN) tablet 500 mg (has no administration in time range)  naproxen (NAPROSYN) tablet 500 mg (has no administration in time range)  ondansetron (ZOFRAN-ODT) disintegrating tablet 4 mg (has no administration in time range)  sodium chloride 0.9 % bolus 1,000 mL (1,000 mLs Intravenous New Bag/Given 08/18/19 1457)  piperacillin-tazobactam (ZOSYN) IVPB 3.375 g (0 g Intravenous Stopped 08/18/19 1534)     Initial Impression / Assessment and Plan / ED Course  I have reviewed the triage vital signs and the nursing notes.  Pertinent labs & imaging results that were available during my care of the patient were reviewed by me and considered in my medical decision making (see chart for details).        Presenting for further evaluation of his left forearm and left foot pain, and recent diagnosis of bacteremia.  He reports feeling weak and tired over the past several days.  He has had nausea without vomiting.  Blood cultures drawn at his last visit were positive, and patient was newly found to be HIV positive.  As he is tachycardic on arrival and sweaty on my exam, will call code sepsis and start antibiotics.  Discussed with pharmacy, who recommended vanc and Zosyn.  1 hour sepsis reassessment performed, heart rate improving with fluids.  Lactic improved from previous.  Leukocytosis resolved from last week.  Electrolytes are stable.  EKG shows sinus tach.  Discussed with Dr. Earnest Conroy from [facility], patient to be admitted for bacteremia and newly diagnosed HIV  Final Clinical Impressions(s) / ED Diagnoses   Final diagnoses:  Bacteremia  HIV infection, unspecified symptom status [facility])    ED Discharge Orders    None       Franchot Heidelberg, PA-C 08/18/19 [place], Jaelle Campanile, PA-C 08/18/19 1610    [place], DO 08/19/19 [phone]

## 2019-08-18 NOTE — ED Notes (Signed)
Pt states left wrist/forearm swelling and left ankle swelling x 1 week, states he slept outside with another person on top of him and extremities have been swelling and painful since.

## 2019-08-18 NOTE — ED Notes (Signed)
ED Provider at bedside. 

## 2019-08-18 NOTE — ED Notes (Signed)
Pt ambulated to bathroom 

## 2019-08-19 ENCOUNTER — Inpatient Hospital Stay (HOSPITAL_COMMUNITY): Payer: Self-pay

## 2019-08-19 DIAGNOSIS — B951 Streptococcus, group B, as the cause of diseases classified elsewhere: Secondary | ICD-10-CM

## 2019-08-19 DIAGNOSIS — B955 Unspecified streptococcus as the cause of diseases classified elsewhere: Secondary | ICD-10-CM | POA: Diagnosis present

## 2019-08-19 DIAGNOSIS — F1920 Other psychoactive substance dependence, uncomplicated: Secondary | ICD-10-CM

## 2019-08-19 DIAGNOSIS — L03012 Cellulitis of left finger: Secondary | ICD-10-CM

## 2019-08-19 DIAGNOSIS — Z21 Asymptomatic human immunodeficiency virus [HIV] infection status: Secondary | ICD-10-CM

## 2019-08-19 DIAGNOSIS — M25472 Effusion, left ankle: Secondary | ICD-10-CM

## 2019-08-19 DIAGNOSIS — L03114 Cellulitis of left upper limb: Secondary | ICD-10-CM

## 2019-08-19 DIAGNOSIS — F17210 Nicotine dependence, cigarettes, uncomplicated: Secondary | ICD-10-CM

## 2019-08-19 DIAGNOSIS — R7881 Bacteremia: Principal | ICD-10-CM | POA: Diagnosis present

## 2019-08-19 DIAGNOSIS — B20 Human immunodeficiency virus [HIV] disease: Secondary | ICD-10-CM

## 2019-08-19 DIAGNOSIS — F1193 Opioid use, unspecified with withdrawal: Secondary | ICD-10-CM

## 2019-08-19 HISTORY — DX: Human immunodeficiency virus (HIV) disease: B20

## 2019-08-19 LAB — URINE CULTURE: Culture: NO GROWTH

## 2019-08-19 LAB — T-HELPER CELLS (CD4) COUNT (NOT AT ARMC)
CD4 % Helper T Cell: 35 % (ref 33–65)
CD4 T Cell Abs: 248 /uL — ABNORMAL LOW (ref 400–1790)

## 2019-08-19 LAB — CBC
HCT: 36.3 % — ABNORMAL LOW (ref 39.0–52.0)
Hemoglobin: 11.4 g/dL — ABNORMAL LOW (ref 13.0–17.0)
MCH: 25.1 pg — ABNORMAL LOW (ref 26.0–34.0)
MCHC: 31.4 g/dL (ref 30.0–36.0)
MCV: 79.8 fL — ABNORMAL LOW (ref 80.0–100.0)
Platelets: 478 10*3/uL — ABNORMAL HIGH (ref 150–400)
RBC: 4.55 MIL/uL (ref 4.22–5.81)
RDW: 14.6 % (ref 11.5–15.5)
WBC: 4.2 10*3/uL (ref 4.0–10.5)
nRBC: 0 % (ref 0.0–0.2)

## 2019-08-19 LAB — CD4/CD8 (T-HELPER/T-SUPPRESSOR CELL)
CD4 absolute: 347 /uL — ABNORMAL LOW (ref 400–1790)
CD4%: 36 % (ref 33–65)
CD8 T Cell Abs: 354 /uL (ref 190–1000)
CD8tox: 37 % (ref 12–40)
Ratio: 0.98 — ABNORMAL LOW (ref 1.0–3.0)
Total lymphocyte count: 963 /uL — ABNORMAL LOW (ref 1000–4000)

## 2019-08-19 LAB — COMPREHENSIVE METABOLIC PANEL
ALT: 33 U/L (ref 0–44)
AST: 20 U/L (ref 15–41)
Albumin: 2.8 g/dL — ABNORMAL LOW (ref 3.5–5.0)
Alkaline Phosphatase: 98 U/L (ref 38–126)
Anion gap: 12 (ref 5–15)
BUN: 10 mg/dL (ref 6–20)
CO2: 22 mmol/L (ref 22–32)
Calcium: 8.8 mg/dL — ABNORMAL LOW (ref 8.9–10.3)
Chloride: 102 mmol/L (ref 98–111)
Creatinine, Ser: 0.69 mg/dL (ref 0.61–1.24)
GFR calc Af Amer: >60 mL/min (ref >60)
GFR calc non Af Amer: >60 mL/min (ref >60)
Glucose, Bld: 106 mg/dL — ABNORMAL HIGH (ref 70–99)
Potassium: 3.8 mmol/L (ref 3.5–5.1)
Sodium: 136 mmol/L (ref 135–145)
Total Bilirubin: 0.3 mg/dL (ref 0.3–1.2)
Total Protein: 7.8 g/dL (ref 6.5–8.1)

## 2019-08-19 LAB — HEPATITIS B SURFACE ANTIGEN: Hepatitis B Surface Ag: NONREACTIVE

## 2019-08-19 MED ORDER — PNEUMOCOCCAL VAC POLYVALENT 25 MCG/0.5ML IJ INJ
0.5000 mL | INJECTION | INTRAMUSCULAR | Status: DC
  Filled 2019-08-19: qty 0.5

## 2019-08-19 MED ORDER — SODIUM CHLORIDE 0.9 % IV SOLN
2.0000 g | INTRAVENOUS | Status: DC
  Filled 2019-08-19: qty 20

## 2019-08-19 MED ORDER — INFLUENZA VAC SPLIT QUAD 0.5 ML IM SUSY: 0.5000 mL | PREFILLED_SYRINGE | INTRAMUSCULAR | Status: DC

## 2019-08-19 MED ORDER — PENICILLIN G POTASSIUM 5000000 UNITS IJ SOLR: 2.0000 10*6.[IU] | Freq: Three times a day (TID) | INTRAVENOUS | Status: DC

## 2019-08-19 MED ORDER — DOCUSATE SODIUM 100 MG PO CAPS
100.0000 mg | ORAL_CAPSULE | Freq: Every day | ORAL | Status: DC
  Filled 2019-08-19: qty 1

## 2019-08-19 MED ORDER — PENICILLIN G POTASSIUM 20000000 UNITS IJ SOLR
12.0000 10*6.[IU] | Freq: Two times a day (BID) | INTRAVENOUS | Status: DC
  Filled 2019-08-19 (×2): qty 12

## 2019-08-19 MED ORDER — ENOXAPARIN SODIUM 40 MG/0.4ML ~~LOC~~ SOLN
40.0000 mg | Freq: Every day | SUBCUTANEOUS | Status: DC
  Administered 2019-08-19: 40 mg via SUBCUTANEOUS
  Filled 2019-08-19: qty 0.4

## 2019-08-19 NOTE — Progress Notes (Addendum)
PROGRESS NOTE    Seth Brooks  IDP:[id number]  DOB: [birth year]  DOA: 08/18/2019 PCP: Patient, No Pcp Per  Brief Narrative:  28 year old male with history of IV drug use and homelessness admitted 11/26-11/27 for left forearm/left foot cellulitis and treated with IV vancomycin and cefepime, subsequently left AMA--did not fill outpatient abx that were later called in.Comes to [facility] feeling poorly, worsening cellulitis-apparently message conveyed from inpatient MD that his blood cultures resulted positive (strep-pyogens) and now also HIV positive. Started on vanc/zosyn per Pharmacy recommendations. Tachycardic but otherwise hemodynamically stable.  Admits to subjective fevers and chills.  Denies any cough, shortness of breath, palpitations.  Patient transferred to [facility] for further evaluation and management.  Subjective:  Patient hypertensive and tachycardic earlier.  Was sleeping and did not want to wake up to answer questions when seen in rounds.  According to nurse had just gotten Atarax for anxiety.  He admits to IV heroin use prior to this admission.  Objective: Vitals:   08/18/19 2124 08/19/19 0023 08/19/19 0500 08/19/19 0609  BP: (!) 163/86 (!) 163/101  (!) 143/100  Pulse: (!) 111 93  (!) 102  Resp: 18 14  16   Temp:  98.2 F (36.8 C)  98.9 F (37.2 C)  TempSrc:  Oral  Oral  SpO2: 100% 100%  99%  Weight:   73.7 kg   Height:        Intake/Output Summary (Last 24 hours) at 08/19/2019 0918 Last data filed at 08/18/2019 1739 Gross per 24 hour  Intake 1241.44 ml  Output -  Net 1241.44 ml   Filed Weights   08/18/19 1313 08/19/19 0500  Weight: 77.1 kg 73.7 kg    Physical Examination:  General exam: Appears drowsy and somnolent Respiratory system: Clear to auscultation. Respiratory effort normal. Cardiovascular system: S1 & S2 heard, RRR. No JVD, murmurs, rubs, gallops or clicks. No pedal edema. Gastrointestinal system: Abdomen is nondistended, soft and  nontender. No organomegaly or masses felt. Normal bowel sounds heard. Central nervous system: Alert and oriented. No new focal neurological deficits. Extremities: No contractures, edema or joint deformities.  Skin: No rashes, lesions or ulcers Psychiatry: Judgement and insight appear could not be assessed  Data Reviewed: I have personally reviewed following labs and imaging studies  CBC: Recent Labs  Lab 08/12/19 1955 08/13/19 1121 08/18/19 1502 08/19/19 0533  WBC 13.2* 9.5 7.2 4.2  NEUTROABS 11.4*  --  5.8  --   HGB 13.6 10.9* 12.4* 11.4*  HCT 40.9 30.8* 38.4* 36.3*  MCV 77.5* 73.7* 78.0* 79.8*  PLT 158 158 512* 478*   Basic Metabolic Panel: Recent Labs  Lab 08/12/19 1955 08/13/19 1121 08/18/19 1502 08/19/19 0533  NA 125* 130* 134* 136  K 3.4* 3.3* 4.2 3.8  CL 89* 94* 102 102  CO2 22 24 23 22   GLUCOSE 135* 99 116* 106*  BUN 8 11 9 10   CREATININE 0.95 0.77 0.65 0.69  CALCIUM 9.0 8.3* 8.8* 8.8*  PHOS  --  2.3*  --   --    GFR: Estimated Creatinine Clearance: 142 mL/min (by C-G formula based on SCr of 0.69 mg/dL). Liver Function Tests: Recent Labs  Lab 08/12/19 1955 08/13/19 1121 08/18/19 1502 08/19/19 0533  AST 28 17 24 20   ALT 18 15 41 33  ALKPHOS 107 79 105 98  BILITOT 1.1 0.8 0.4 0.3  PROT 7.7 5.9* 8.2* 7.8  ALBUMIN 3.1* 2.2* 2.8* 2.8*   No results for input(s): LIPASE, AMYLASE in the last 168  hours. No results for input(s): AMMONIA in the last 168 hours. Coagulation Profile: Recent Labs  Lab 08/18/19 1502  INR 1.1   Cardiac Enzymes: No results for input(s): CKTOTAL, CKMB, CKMBINDEX, TROPONINI in the last 168 hours. BNP (last 3 results) No results for input(s): PROBNP in the last 8760 hours. HbA1C: No results for input(s): HGBA1C in the last 72 hours. CBG: No results for input(s): GLUCAP in the last 168 hours. Lipid Profile: No results for input(s): CHOL, HDL, LDLCALC, TRIG, CHOLHDL, LDLDIRECT in the last 72 hours. Thyroid Function Tests: No  results for input(s): TSH, T4TOTAL, FREET4, T3FREE, THYROIDAB in the last 72 hours. Anemia Panel: No results for input(s): VITAMINB12, FOLATE, FERRITIN, TIBC, IRON, RETICCTPCT in the last 72 hours. Sepsis Labs: Recent Labs  Lab 08/12/19 1958 08/13/19 0027 08/18/19 1502  LATICACIDVEN 3.0* 2.9* 0.9    Recent Results (from the past 240 hour(s))  Blood culture (routine x 2)     Status: None   Collection Time: 08/12/19  7:54 PM   Specimen: BLOOD RIGHT ARM  Result Value Ref Range Status   Specimen Description BLOOD RIGHT ARM  Final   Special Requests   Final    AEROBIC BOTTLE ONLY Blood Culture results may not be optimal due to an inadequate volume of blood received in culture bottles   Culture   Final    NO GROWTH 5 DAYS Performed at [facility], [address], [place], [place] [address]    Report Status 08/17/2019 FINAL  Final  Blood culture (routine x 2)     Status: Abnormal   Collection Time: 08/13/19 12:10 AM   Specimen: BLOOD RIGHT HAND  Result Value Ref Range Status   Specimen Description BLOOD RIGHT HAND  Final   Special Requests   Final    BOTTLES DRAWN AEROBIC AND ANAEROBIC Blood Culture adequate volume   Culture  Setup Time   Final    GRAM POSITIVE COCCI IN CHAINS IN BOTH AEROBIC AND ANAEROBIC BOTTLES CRITICAL RESULT CALLED TO, READ BACK BY AND VERIFIED WITH: PHARMD JEREMY F. 1524 [id number] FCP    Culture (A)  Final    STREPTOCOCCUS PYOGENES HEALTH DEPARTMENT NOTIFIED Performed at [facility], [address] [place], [place] [address]    Report Status 08/15/2019 FINAL  Final   Organism ID, Bacteria STREPTOCOCCUS PYOGENES  Final      Susceptibility   Streptococcus pyogenes - MIC*    PENICILLIN <=0.06 SENSITIVE Sensitive     CEFTRIAXONE <=0.12 SENSITIVE Sensitive     ERYTHROMYCIN 2 RESISTANT Resistant     LEVOFLOXACIN 0.5 SENSITIVE Sensitive     VANCOMYCIN 0.5 SENSITIVE Sensitive     * STREPTOCOCCUS PYOGENES  SARS CORONAVIRUS 2 (TAT 6-24 HRS)  Nasopharyngeal Nasopharyngeal Swab     Status: None   Collection Time: 08/13/19 12:27 AM   Specimen: Nasopharyngeal Swab  Result Value Ref Range Status   SARS Coronavirus 2 NEGATIVE NEGATIVE Final    Comment: (NOTE) SARS-CoV-2 target nucleic acids are NOT DETECTED. The SARS-CoV-2 RNA is generally detectable in upper and lower respiratory specimens during the acute phase of infection. Negative results do not preclude SARS-CoV-2 infection, do not rule out co-infections with other pathogens, and should not be used as the sole basis for treatment or other patient management decisions. Negative results must be combined with clinical observations, patient history, and epidemiological information. The expected result is Negative. Fact Sheet for Patients: HairSlick.no Fact Sheet for Healthcare Providers: quierodirigir.com This test is not  yet approved or cleared by the [place] FDA and  has been authorized for detection and/or diagnosis of SARS-CoV-2 by FDA under an Emergency Use Authorization (EUA). This EUA will remain  in effect (meaning this test can be used) for the duration of the COVID-19 declaration under Section 56 4(b)(1) of the Act, 21 U.S.C. section 360bbb-3(b)(1), unless the authorization is terminated or revoked sooner. Performed at [facility], [address] [place], [place] [address]   SARS CORONAVIRUS 2 (TAT 6-24 HRS) Nasopharyngeal Nasopharyngeal Swab     Status: None   Collection Time: 08/18/19  3:02 PM   Specimen: Nasopharyngeal Swab  Result Value Ref Range Status   SARS Coronavirus 2 NEGATIVE NEGATIVE Final    Comment: (NOTE) SARS-CoV-2 target nucleic acids are NOT DETECTED. The SARS-CoV-2 RNA is generally detectable in upper and lower respiratory specimens during the acute phase of infection. Negative results do not preclude SARS-CoV-2 infection, do not rule out co-infections with other pathogens,  and should not be used as the sole basis for treatment or other patient management decisions. Negative results must be combined with clinical observations, patient history, and epidemiological information. The expected result is Negative. Fact Sheet for Patients: HairSlick.nohttps://www.fda.gov/media/138098/download Fact Sheet for Healthcare Providers: quierodirigir.comhttps://www.fda.gov/media/138095/download This test is not yet approved or cleared by the [place] FDA and  has been authorized for detection and/or diagnosis of SARS-CoV-2 by FDA under an Emergency Use Authorization (EUA). This EUA will remain  in effect (meaning this test can be used) for the duration of the COVID-19 declaration under Section 56 4(b)(1) of the Act, 21 U.S.C. section 360bbb-3(b)(1), unless the authorization is terminated or revoked sooner. Performed at [facility], [address] [place], [place] [address]   Blood Culture (routine x 2)     Status: None (Preliminary result)   Collection Time: 08/18/19  3:03 PM   Specimen: BLOOD  Result Value Ref Range Status   Specimen Description   Final    BLOOD BLOOD LEFT ARM Performed at [facility] [place] [facility], [address] [place], [place] [address]    Special Requests   Final    BOTTLES DRAWN AEROBIC AND ANAEROBIC Blood Culture adequate volume Performed at [facility] [place], [address] [place], [place] [address]    Culture   Final    NO GROWTH < 24 HOURS Performed at [facility], [address] [place], [place] [address]    Report Status PENDING  Incomplete      Radiology Studies: Dg Chest Portable 1 View  Result Date: 08/18/2019 CLINICAL DATA:  Sepsis. EXAM: PORTABLE CHEST 1 VIEW COMPARISON:  None. FINDINGS: The heart size and mediastinal contours are within normal limits. Both lungs are clear. The visualized skeletal structures are unremarkable. IMPRESSION: No active disease. Electronically Signed   By: Lupita RaiderJames  Green Jr M.D.   On: 08/18/2019  15:45        Scheduled Meds: . cloNIDine  0.1 mg Oral QID   Followed by  . [START ON 08/20/2019] cloNIDine  0.1 mg Oral BID   Followed by  . [START ON 08/23/2019] cloNIDine  0.1 mg Oral Daily  . docusate sodium  100 mg Oral Daily  . enoxaparin (LOVENOX) injection  40 mg Subcutaneous QHS  . influenza vac split quadrivalent PF  0.5 mL Intramuscular Tomorrow-1000  . pneumococcal 23 valent vaccine  0.5 mL Intramuscular Tomorrow-1000   Continuous Infusions: . piperacillin-tazobactam (ZOSYN)  IV 3.375 g (08/19/19 0758)  .  vancomycin 1,750 mg (08/19/19 0513)    Assessment & Plan:   1.  Left upper extremity cellulitis/Streptococcus pyogenous bacteremia: Admitted recently with incomplete treatment and signed out AMA.  Admitted with IV Zosyn/vancomycin.  Blood culture report indicates sensitivity to penicillin, Rocephin, Levaquin and vancomycin.  Will change to IV Rocephin or penicillin G.  Repeat blood cultures sent on 12/2.  Supportive management with analgesics/antiemetics  2.  New diagnosis of HIV: Labs for quantitative HIV RNA and CD4 counts sent.  Results pending.  Will consult ID.  3.  History of IV drug abuse: Urine drug screen on 11/27+ for amphetamines, benzodiazepines, opiates and marijuana. Patient tachycardic/hypertensive in the ED and earlier today-started on clonidine taper on admission for possible withdrawal.  Also on hydroxyzine as needed anxiety.  Continue this regimen for now, watch for hypotension. Would like to avoid CIWA protocol/opiates in this patient with drug dependence   DVT prophylaxis: Lovenox Code Status: Full code Family / Patient Communication: Discussed with patient Disposition Plan: Likely will need to stay as inpatient until completion of IV antibiotics.     LOS: 1 day    Time spent: 35 minutes    Guilford Shi, MD [facility] Pager [phone]  If 7PM-7AM, please contact night-coverage [email or web address] Password [email or web address] 08/19/2019,  9:18 AM

## 2019-08-19 NOTE — H&P (Signed)
TRH H&P   Patient Demographics:    Seth Brooks, is a 28 y.o. male  MRN: [id number]   DOB - 10-22-1990  Admit Date - 08/18/2019  Outpatient Primary MD for the patient is Patient, No Pcp Per  Outpatient Specialists: n/a    Patient coming from: [facility]  Chief Complaint  Patient presents with  . Arm Problem  . Foot Pain      HPI:    Seth Brooks  is a 28 y.o. male, with IVDU, homeless who presented after being called back for bacteremia.  Patient was hospitalized 11/26-11/28/2020, for left arm cellulitis.  Left AMA, all collected blood cultures came back positive from strep pyogenes, difficulty contacting patient.  He was eventually contacted and advised to come back for IV antibiotics.  Admits to subjective fevers and chills.  Denies any cough, shortness of breath, palpitations, orthopnea or PND.   Review of systems:  Review of Systems:  Constitutional: see HPI HEENT: negative for earaches, epistaxis, or sore throat Respiratory: negative for dyspnea, cough, or wheezing Cardiovascular: negative for chest pain, palpitations, or syncope GU: negative for dysuria, urinary frequency, urinary urgency, hematuria Gastrointestinal: negative for abdominal pain, constipation, diarrhea, nausea or vomiting Musculoskeletal: negative for arthralgias, back pain or myalgias Neurological: negative for dizziness, headaches or weakness Behavioral/Psych: negative for suicidal or homicidal ideation Skin:negative for rash Heme: negative for bruises Endo: negative for hair loss, weight gain/loss  With Past History of the following :   Past Medical History:  Diagnosis Date  . IV drug user       History reviewed. No pertinent  surgical history.   Social History:     Social History   Tobacco Use  . Smoking status: Current Every Day Smoker    Types: Cigarettes  . Smokeless tobacco: Never Used  Substance Use Topics  . Alcohol use: No      Family History :     Family History  Problem Relation Age of Onset  . Cancer Father      Home Medications:   Prior to Admission medications   Medication Sig Start Date End Date Taking? Authorizing Provider  doxycycline (VIBRAMYCIN) 100 MG capsule Take 1 capsule (100 mg total) by mouth 2 (two) times daily. 08/14/19   Almon Hercules, MD     Allergies:    No Known  Allergies   Physical Exam:   Vitals  Blood pressure (!) 143/100, pulse (!) 102, temperature 98.9 F (37.2 C), temperature source Oral, resp. rate 16, height 6' (1.829 m), weight 73.7 kg, SpO2 99 %.  Physical Exam   Constitutional - resting comfortably, no acute distress Eyes - pupils equal round and reactive to light and accomodation, extra ocular movements intact Nose - no gross deformity or drainage Mouth - no oral lesions noted Throat - no swelling or erythema Neck - supple, no JVD   CV - (+)S1S2, no murmurs  Resp - CTA bilaterally, no wheezing or crackles,  GI - (+)BS, soft, non-tender, non-distended Extrem - no clubbing, cyanosis, or peripheral edema  Skin - no rashes or wounds Neuro - alert, aware, oriented to person/place/time  Psych - normal affect, no anxiety   Patient has Pressure Ulcer on Admission?: no   Data Review:    CBC Recent Labs  Lab 08/12/19 1955 08/13/19 1121 08/18/19 1502 08/19/19 0533  WBC 13.2* 9.5 7.2 4.2  HGB 13.6 10.9* 12.4* 11.4*  HCT 40.9 30.8* 38.4* 36.3*  PLT 158 158 512* 478*  MCV 77.5* 73.7* 78.0* 79.8*  MCH 25.8* 26.1 25.2* 25.1*  MCHC 33.3 35.4 32.3 31.4  RDW 14.4 14.4 14.6 14.6  LYMPHSABS 0.7  --  0.7  --   MONOABS 0.9  --  0.6  --   EOSABS 0.0  --  0.0  --   BASOSABS 0.0  --  0.0  --     ------------------------------------------------------------------------------------------------------------------  Chemistries  Recent Labs  Lab 08/12/19 1955 08/13/19 1121 08/18/19 1502 08/19/19 0533  NA 125* 130* 134* 136  K 3.4* 3.3* 4.2 3.8  CL 89* 94* 102 102  CO2 22 24 23 22   GLUCOSE 135* 99 116* 106*  BUN 8 11 9 10   CREATININE 0.95 0.77 0.65 0.69  CALCIUM 9.0 8.3* 8.8* 8.8*  AST 28 17 24 20   ALT 18 15 41 33  ALKPHOS 107 79 105 98  BILITOT 1.1 0.8 0.4 0.3   ------------------------------------------------------------------------------------------------------------------ estimated creatinine clearance is 142 mL/min (by C-G formula based on SCr of 0.69 mg/dL). ------------------------------------------------------------------------------------------------------------------ No results for input(s): TSH, T4TOTAL, T3FREE, THYROIDAB in the last 72 hours.  Invalid input(s): FREET3  Coagulation profile Recent Labs  Lab 08/18/19 1502  INR 1.1   ------------------------------------------------------------------------------------------------------------------- No results for input(s): DDIMER in the last 72 hours. -------------------------------------------------------------------------------------------------------------------  Cardiac Enzymes No results for input(s): CKMB, TROPONINI, MYOGLOBIN in the last 168 hours.  Invalid input(s): CK ------------------------------------------------------------------------------------------------------------------ No results found for: BNP   ---------------------------------------------------------------------------------------------------------------  Urinalysis    Component Value Date/Time   COLORURINE YELLOW 08/18/2019 1556   APPEARANCEUR CLOUDY (A) 08/18/2019 1556   LABSPEC 1.025 08/18/2019 1556   PHURINE 6.5 08/18/2019 1556   GLUCOSEU NEGATIVE 08/18/2019 1556   HGBUR NEGATIVE 08/18/2019 1556   BILIRUBINUR NEGATIVE  08/18/2019 1556   KETONESUR NEGATIVE 08/18/2019 1556   PROTEINUR NEGATIVE 08/18/2019 1556   NITRITE NEGATIVE 08/18/2019 1556   LEUKOCYTESUR NEGATIVE 08/18/2019 1556    ----------------------------------------------------------------------------------------------------------------   Imaging Results:    Dg Chest Portable 1 View  Result Date: 08/18/2019 CLINICAL DATA:  Sepsis. EXAM: PORTABLE CHEST 1 VIEW COMPARISON:  None. FINDINGS: The heart size and mediastinal contours are within normal limits. Both lungs are clear. The visualized skeletal structures are unremarkable. IMPRESSION: No active disease. Electronically Signed   By: Lupita RaiderJames  Green Jr M.D.   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  Was started on Zosyn and vancomycin in the ER which will be continued.  Patient will benefit from ID consultation    Newly diagnosed HIV-positive: HIV test came back positive during last admission.  He will need CD4 count, quantitative HIV RT-PCR and ID consultation  DVT Prophylaxis Heparin -  Lovenox - SCDs   AM Labs Ordered, also please review Full Orders  Family Communication: Admission, patients condition and plan of care including tests being ordered have been discussed with the patient who indicate understanding and agree with the plan and Code Status.  Code Status full code  Likely DC to home  Condition GUARDED    Consults called: N/a    Admission status: Admit inpatient  Time spent in minutes : 55   Peyton Bottoms M.D on 08/19/2019 at 7:03 AM  To page go to [email or web address] - password [facility]

## 2019-08-19 NOTE — Consult Note (Signed)
[facility]  Total days of antibiotics 1               Reason for Consult: group b strep bacteremia and hiv +   Referring Physician: Lajuana Ripple  Principal Problem:   Streptococcal bacteremia Active Problems:   Cellulitis   Bacteremia   Newly diagnosed HIV-positive ([facility])    HPI: Seth Brooks is a 28 y.o. male with hx of ivdu, seen in the ED for left arm swelling and cellulitis from injection. And now has left ankle swelling. He feels like he is having drug withdrawal. Often injects heroin and meth. He reports that his left wrist and forearm still quite tender.   This is new diagnosis of hiv. He previously tested negative earlier this year while in prison. His sexual preference is with women. He has not known to have sex with HIV + male. Other main risk factor - is sharing needles with others.  Hx of hep C ab only  Past Medical History:  Diagnosis Date  . IV drug user     Allergies: No Known Allergies   MEDICATIONS: . cloNIDine  0.1 mg Oral QID   Followed by  . [START ON 08/20/2019] cloNIDine  0.1 mg Oral BID   Followed by  . [START ON 08/23/2019] cloNIDine  0.1 mg Oral Daily  . docusate sodium  100 mg Oral Daily  . enoxaparin (LOVENOX) injection  40 mg Subcutaneous QHS  . influenza vac split quadrivalent PF  0.5 mL Intramuscular Tomorrow-1000  . pneumococcal 23 valent vaccine  0.5 mL Intramuscular Tomorrow-1000    Social History   Tobacco Use  . Smoking status: Current Every Day Smoker    Types: Cigarettes  . Smokeless tobacco: Never Used  Substance Use Topics  . Alcohol use: No  . Drug use: Yes    Types: IV    Family History  Problem Relation Age of Onset  . Cancer Father      Review of Systems  Constitutional: Negative for fever, chills, diaphoresis, activity change, appetite change, fatigue and unexpected weight change.  HENT: Negative for congestion, sore throat, rhinorrhea, sneezing, trouble swallowing and sinus pressure.   Eyes: Negative for photophobia and visual disturbance.  Respiratory: Negative for cough, chest tightness, shortness of breath, wheezing and stridor.  Cardiovascular: Negative for chest pain, palpitations and leg swelling.  Gastrointestinal: Negative for nausea, vomiting, abdominal pain, diarrhea, constipation, blood in stool, abdominal distention and anal bleeding.  Genitourinary: Negative for dysuria, hematuria, flank pain and difficulty urinating.  Musculoskeletal: Negative for myalgias, back pain, joint swelling, arthralgias and gait problem.  Skin: + erythema nad swelling to left wrist and ankle Neurological: Negative for dizziness, tremors, weakness and light-headedness.  Hematological: Negative for adenopathy. Does not bruise/bleed easily.  Psychiatric/Behavioral: Negative for behavioral problems, confusion, sleep disturbance, dysphoric mood, decreased concentration and agitation.     OBJECTIVE: Temp:  [97.5 F (36.4 C)-98.9 F (37.2 C)] 97.5 F (36.4 C) (12/03 1331) Pulse Rate:  [85-128] 85 (12/03 1331) Resp:  [11-22] 16 (12/03 1331) BP: (134-166)/(78-102) 138/89 (12/03 1331) SpO2:  [98 %-100 %] 98 % (12/03 1331) Weight:  [73.7 kg] 73.7 kg (12/03 0500) Physical Exam  Constitutional: He is oriented to person, place, and time. He appears well-developed and well-nourished. No distress.  HENT:  Mouth/Throat: Oropharynx is clear and moist. No oropharyngeal exudate.  Cardiovascular: Normal rate, regular rhythm and normal heart sounds. Exam reveals no gallop and no friction rub.  No murmur heard.  Pulmonary/Chest: Effort normal and breath sounds normal. No respiratory distress. He has no wheezes.  Abdominal: Soft. Bowel sounds are normal. He exhibits no distension. There is no tenderness.  Lymphadenopathy:  He has no cervical adenopathy.  Neurological: He is alert and oriented to person, place, and time.  Skin: Skin is warm and swelling to left wrist and forearm. Left dorsum of  foot is erythematous Psychiatric: He has a normal mood and affect. His behavior is normal.     LABS: Results for orders placed or performed during the hospital encounter of 08/18/19 (from the past 48 hour(s))  Lactic acid, plasma     Status: None   Collection Time: 08/18/19  3:02 PM  Result Value Ref Range   Lactic Acid, Venous 0.9 0.5 - 1.9 mmol/L    Comment: Performed at [facility], [address] [place], [place] [address]  Comprehensive metabolic panel     Status: Abnormal   Collection Time: 08/18/19  3:02 PM  Result Value Ref Range   Sodium 134 (L) 135 - 145 mmol/L   Potassium 4.2 3.5 - 5.1 mmol/L   Chloride 102 98 - 111 mmol/L   CO2 23 22 - 32 mmol/L   Glucose, Bld 116 (H) 70 - 99 mg/dL   BUN 9 6 - 20 mg/dL   Creatinine, Ser 1.91 0.61 - 1.24 mg/dL   Calcium 8.8 (L) 8.9 - 10.3 mg/dL   Total Protein 8.2 (H) 6.5 - 8.1 g/dL   Albumin 2.8 (L) 3.5 - 5.0 g/dL   AST 24 15 - 41 U/L   ALT 41 0 - 44 U/L   Alkaline Phosphatase 105 38 - 126 U/L   Total Bilirubin 0.4 0.3 - 1.2 mg/dL   GFR calc non Af Amer >60 >60 mL/min   GFR calc Af Amer >60 >60 mL/min   Anion gap 9 5 - 15    Comment: Performed at [facility], [address] [place], [place] [address]  CBC WITH DIFFERENTIAL     Status: Abnormal   Collection Time: 08/18/19  3:02 PM  Result Value Ref Range   WBC 7.2 4.0 - 10.5 K/uL   RBC 4.92 4.22 - 5.81 MIL/uL   Hemoglobin 12.4 (L) 13.0 - 17.0 g/dL   HCT 56.2 (L) 13.0 - 86.5 %   MCV 78.0 (L) 80.0 - 100.0 fL   MCH 25.2 (L) 26.0 - 34.0 pg   MCHC 32.3 30.0 - 36.0 g/dL   RDW 78.4 69.6 - 29.5 %   Platelets 512 (H) 150 - 400 K/uL   nRBC 0.0 0.0 - 0.2 %   Neutrophils Relative % 81 %   Neutro Abs 5.8 1.7 - 7.7 K/uL   Lymphocytes Relative 10 %   Lymphs Abs 0.7 0.7 - 4.0 K/uL   Monocytes Relative 8 %   Monocytes Absolute 0.6 0.1 - 1.0 K/uL   Eosinophils Relative 0 %   Eosinophils Absolute 0.0 0.0 - 0.5 K/uL   Basophils Relative 0 %   Basophils Absolute  0.0 0.0 - 0.1 K/uL   Immature Granulocytes 1 %   Abs Immature Granulocytes 0.06 0.00 - 0.07 K/uL    Comment: Performed at [facility], [address] [place], [place] [address]  APTT     Status: None   Collection Time: 08/18/19  3:02 PM  Result Value Ref Range   aPTT 32 24 - 36 seconds    Comment: Performed at [facility]  [address] [place], [place] [address]  Protime-INR     Status: None   Collection Time: 08/18/19  3:02 PM  Result Value Ref Range   Prothrombin Time 13.7 11.4 - 15.2 seconds   INR 1.1 0.8 - 1.2    Comment: (NOTE) INR goal varies based on device and disease states. Performed at [facility], [address] [place], [place] [address]   SARS CORONAVIRUS 2 (TAT 6-24 HRS) Nasopharyngeal Nasopharyngeal Swab     Status: None   Collection Time: 08/18/19  3:02 PM   Specimen: Nasopharyngeal Swab  Result Value Ref Range   SARS Coronavirus 2 NEGATIVE NEGATIVE    Comment: (NOTE) SARS-CoV-2 target nucleic acids are NOT DETECTED. The SARS-CoV-2 RNA is generally detectable in upper and lower respiratory specimens during the acute phase of infection. Negative results do not preclude SARS-CoV-2 infection, do not rule out co-infections with other pathogens, and should not be used as the sole basis for treatment or other patient management decisions. Negative results must be combined with clinical observations, patient history, and epidemiological information. The expected result is Negative. Fact Sheet for Patients: HairSlick.no Fact Sheet for Healthcare Providers: quierodirigir.com This test is not yet approved or cleared by the [place] FDA and  has been authorized for detection and/or diagnosis of SARS-CoV-2 by FDA under an Emergency Use Authorization (EUA). This EUA will remain  in effect (meaning this test can be used) for the duration of the COVID-19 declaration under  Section 56 4(b)(1) of the Act, 21 U.S.C. section 360bbb-3(b)(1), unless the authorization is terminated or revoked sooner. Performed at [facility], [address], [place], [place] [address]   T-helper cells (CD4) count (not at [facility])     Status: Abnormal   Collection Time: 08/18/19  3:02 PM  Result Value Ref Range   CD4 T Cell Abs 248 (L) 400 - 1,790 /uL   CD4 % Helper T Cell 35 33 - 65 %    Comment: Performed at [facility], [address] [place], [place] [address]  Blood Culture (routine x 2)     Status: None (Preliminary result)   Collection Time: 08/18/19  3:03 PM   Specimen: BLOOD  Result Value Ref Range   Specimen Description      BLOOD BLOOD LEFT ARM Performed at [facility], [address] [place], [place] [address]    Special Requests      BOTTLES DRAWN AEROBIC AND ANAEROBIC Blood Culture adequate volume Performed at [facility], [address] [place], [place] [address]    Culture      NO GROWTH < 24 HOURS Performed at [facility], [address] [place], [place] [address]    Report Status PENDING   Urinalysis, Routine w reflex microscopic     Status: Abnormal   Collection Time: 08/18/19  3:56 PM  Result Value Ref Range   Color, Urine YELLOW YELLOW   APPearance CLOUDY (A) CLEAR   Specific Gravity, Urine 1.025 1.005 - 1.030   pH 6.5 5.0 - 8.0   Glucose, UA NEGATIVE NEGATIVE mg/dL   Hgb urine dipstick NEGATIVE NEGATIVE   Bilirubin Urine NEGATIVE NEGATIVE   Ketones, ur NEGATIVE NEGATIVE mg/dL   Protein, ur NEGATIVE NEGATIVE mg/dL   Nitrite NEGATIVE NEGATIVE   Leukocytes,Ua NEGATIVE NEGATIVE    Comment: Microscopic not done on urines with negative protein, blood, leukocytes, nitrite, or glucose <  500 mg/dL. Performed at [facility], [address] [place], [place] [address]   Urine culture     Status: None   Collection Time: 08/18/19  3:56 PM   Specimen: In/Out Cath Urine  Result Value  Ref Range   Specimen Description      IN/OUT CATH URINE Performed at [facility], [address] [place], [place] [address]    Special Requests      NONE Performed at [facility], [address] [place], [place] [address]    Culture      NO GROWTH Performed at [facility], [address] [place], [place] [address]    Report Status 08/19/2019 FINAL   Comprehensive metabolic panel     Status: Abnormal   Collection Time: 08/19/19  5:33 AM  Result Value Ref Range   Sodium 136 135 - 145 mmol/L   Potassium 3.8 3.5 - 5.1 mmol/L   Chloride 102 98 - 111 mmol/L   CO2 22 22 - 32 mmol/L   Glucose, Bld 106 (H) 70 - 99 mg/dL   BUN 10 6 - 20 mg/dL   Creatinine, Ser 3.240.69 0.61 - 1.24 mg/dL   Calcium 8.8 (L) 8.9 - 10.3 mg/dL   Total Protein 7.8 6.5 - 8.1 g/dL   Albumin 2.8 (L) 3.5 - 5.0 g/dL   AST 20 15 - 41 U/L   ALT 33 0 - 44 U/L   Alkaline Phosphatase 98 38 - 126 U/L   Total Bilirubin 0.3 0.3 - 1.2 mg/dL   GFR calc non Af Amer >60 >60 mL/min   GFR calc Af Amer >60 >60 mL/min   Anion gap 12 5 - 15    Comment: Performed at [facility], [address] [place], [place] [address]  CBC     Status: Abnormal   Collection Time: 08/19/19  5:33 AM  Result Value Ref Range   WBC 4.2 4.0 - 10.5 K/uL   RBC 4.55 4.22 - 5.81 MIL/uL   Hemoglobin 11.4 (L) 13.0 - 17.0 g/dL   HCT 72.536.3 (L) 36.639.0 - 44.052.0 %   MCV 79.8 (L) 80.0 - 100.0 fL   MCH 25.1 (L) 26.0 - 34.0 pg   MCHC 31.4 30.0 - 36.0 g/dL   RDW 34.714.6 42.511.5 - 95.615.5 %   Platelets 478 (H) 150 - 400 K/uL   nRBC 0.0 0.0 - 0.2 %    Comment: Performed at [facility], [address] [place], [place] [address]  Cd4/cd8 (t-helper/t-suppressor cell)     Status: Abnormal   Collection Time: 08/19/19  7:36 AM  Result Value Ref Range   Total lymphocyte count 963 (L) 1,000 - 4,000 /uL   CD4% 36 33 - 65 %   CD4 absolute 347 (L) 400 - 1,790 /uL   CD8tox 37 12 - 40 %   CD8 T Cell Abs 354 190  - 1,000 /uL   Ratio 0.98 (L) 1.0 - 3.0    Comment: Performed at [facility], [address] [place], [place] [address]    MICRO: Blood cx x 1 group b strep IMAGING: Dg Chest Portable 1 View  Result Date: 08/18/2019 CLINICAL DATA:  Sepsis. EXAM: PORTABLE CHEST 1 VIEW COMPARISON:  None. FINDINGS: The heart size and mediastinal contours are within normal limits. Both lungs are clear. The visualized skeletal structures are unremarkable. IMPRESSION: No active disease. Electronically Signed   By: Fayrene FearingJames  Murlean Caller M.D.   On: 08/18/2019 15:45    Assessment/Plan:  28yo M with cellulitis and possible deep tissue abscess to left arm and wrist with secondary bacteremia. Plus also found to have newly diagnosed hiv disease  hiv disease = awaiting viral load. Likely to start symtuza tomorrow and give him 30d access to meds  Group b strep bacteremia = continue on penicillin  Left arm cellulitis = recommend MRI imaging of left forearm and hand to see if any abscess that need debridement. And left ankle xray  Hep c ab = please check hep c viral load

## 2019-08-19 NOTE — Progress Notes (Signed)
Pt w/ hx of IV drug use stated around 1800 he was getting antsy and was wanting to leave. RN reminded pt that he had stated earlier this AM that he wanted to stay and get help especially w/ the new dx of HIV and wanted to start on meds. He stated he wanted to do that but he was feeling he wanted to leave. RN gave PRN to help with anxiety at 1843, pt left AMA 1917, notified on call provider.

## 2019-08-20 LAB — HEPATITIS B SURFACE ANTIBODY, QUANTITATIVE: Hep B S AB Quant (Post): >1000 m[IU]/mL (ref >9.9)

## 2019-08-20 LAB — RPR: RPR Ser Ql: NONREACTIVE

## 2019-08-20 LAB — HIV-1 RNA QUANT-NO REFLEX-BLD
HIV 1 RNA Quant: 834000 copies/mL
LOG10 HIV-1 RNA: 5.921 log10copy/mL

## 2019-08-20 LAB — HCV RNA QUANT: HCV Quantitative: NOT DETECTED IU/mL (ref >50)

## 2019-08-23 LAB — CULTURE, BLOOD (ROUTINE X 2)
Culture: NO GROWTH
Special Requests: ADEQUATE

## 2019-08-31 NOTE — Discharge Summary (Signed)
Physician Discharge Summary  Seth Brooks FGH:[id number] DOB: [birth year] DOA: 08/18/2019  PCP: Patient, No Pcp Per  Admit date: 08/18/2019 Discharge date: Signed out AMA on 08/19/2019 Consultations: Infectious diseases Admitted From: Home Disposition: Signed out AMA  Discharge Diagnoses:  Principal Problem:   Streptococcal bacteremia Active Problems:   Cellulitis   Bacteremia   Newly diagnosed HIV-positive [facility])   Hospital Course Summary: 28 year old male with history of IV drug use and homelessness admitted 11/26-11/27 for left forearm/left foot cellulitis and treated with IV vancomycin and cefepime, subsequently left AMA--did not fill outpatient abx that were later called in.Comes to [facility] feeling poorly, worsening cellulitis-apparently message conveyed from inpatient MD that his blood cultures resulted positive (strep-pyogens) and now also HIV positive. Started on vanc/zosyn per Pharmacy recommendations.  Patient noted to be tachycardic but otherwise hemodynamically stable.  He reported subjective fevers and chills. Denied any cough, shortness of breath, palpitations.  Patient transferred to [facility] for further evaluation and management.  1.  Left upper extremity cellulitis/Streptococcus pyogenous bacteremia: Admitted recently with incomplete treatment and signed out AMA.  Admitted again on 12/2 with IV Zosyn/vancomycin.  Blood culture report indicated sensitivity to penicillin, Rocephin, Levaquin and vancomycin.  I discussed with ID, Dr. Ilsa Iha and changed antibiotic regimen to penicillin G. Repeat blood cultures sent on 12/2.  Supportive management with analgesics/antiemetics were prescribed.  Seen by ID later in the day and MRI was recommended to rule out underlying abscess but it appears that patient signed out AMA later that night at 7 PM  2.  New diagnosis of HIV: Labs for quantitative HIV RNA and CD4 counts sent.  Patient was evaluated by ID, Dr. Ilsa Iha who wanted to  await viral load results before starting Symtuza and giving him 30-day Axis II meds.  3.  History of IV drug abuse: Urine drug screen on 11/27+ for amphetamines, benzodiazepines, opiates and marijuana. Patient tachycardic/hypertensive in the ED and earlier today-started on clonidine taper on admission for possible withdrawal, was also prescribed hydroxyzine as needed for anxiety.  Plan was to watch him closely and initiate CIWA protocol if has significant deterioration.  It appears that patient signed out AMA later that evening.   Discharge Exam:  Vitals:   08/19/19 0609 08/19/19 1331  BP: (!) 143/100 138/89  Pulse: (!) 102 85  Resp: 16 16  Temp: 98.9 F (37.2 C) (!) 97.5 F (36.4 C)  SpO2: 99% 98%   Vitals:   08/19/19 0023 08/19/19 0500 08/19/19 0609 08/19/19 1331  BP: (!) 163/101  (!) 143/100 138/89  Pulse: 93  (!) 102 85  Resp: 14  16 16   Temp: 98.2 F (36.8 C)  98.9 F (37.2 C) (!) 97.5 F (36.4 C)  TempSrc: Oral  Oral Oral  SpO2: 100%  99% 98%  Weight:  73.7 kg    Height:        Discharge Instructions:   Allergies as of 08/19/2019   No Known Allergies     Medication List    ASK your doctor about these medications   doxycycline 100 MG capsule Commonly known as: VIBRAMYCIN Take 1 capsule (100 mg total) by mouth 2 (two) times daily.       No Known Allergies    The results of significant diagnostics from this hospitalization (including imaging, microbiology, ancillary and laboratory) are listed below for reference.    Labs: BNP (last 3 results) No results for input(s): BNP in the last 8760 hours. Basic Metabolic Panel: No results for input(s):  NA, K, CL, CO2, GLUCOSE, BUN, CREATININE, CALCIUM, MG, PHOS in the last 168 hours. Liver Function Tests: No results for input(s): AST, ALT, ALKPHOS, BILITOT, PROT, ALBUMIN in the last 168 hours. No results for input(s): LIPASE, AMYLASE in the last 168 hours. No results for input(s): AMMONIA in the last 168  hours. CBC: No results for input(s): WBC, NEUTROABS, HGB, HCT, MCV, PLT in the last 168 hours. Cardiac Enzymes: No results for input(s): CKTOTAL, CKMB, CKMBINDEX, TROPONINI in the last 168 hours. BNP: Invalid input(s): POCBNP CBG: No results for input(s): GLUCAP in the last 168 hours. D-Dimer No results for input(s): DDIMER in the last 72 hours. Hgb A1c No results for input(s): HGBA1C in the last 72 hours. Lipid Profile No results for input(s): CHOL, HDL, LDLCALC, TRIG, CHOLHDL, LDLDIRECT in the last 72 hours. Thyroid function studies No results for input(s): TSH, T4TOTAL, T3FREE, THYROIDAB in the last 72 hours.  Invalid input(s): FREET3 Anemia work up No results for input(s): VITAMINB12, FOLATE, FERRITIN, TIBC, IRON, RETICCTPCT in the last 72 hours. Urinalysis    Component Value Date/Time   COLORURINE YELLOW 08/18/2019 1556   APPEARANCEUR CLOUDY (A) 08/18/2019 1556   LABSPEC 1.025 08/18/2019 1556   PHURINE 6.5 08/18/2019 1556   GLUCOSEU NEGATIVE 08/18/2019 1556   HGBUR NEGATIVE 08/18/2019 1556   BILIRUBINUR NEGATIVE 08/18/2019 1556   KETONESUR NEGATIVE 08/18/2019 1556   PROTEINUR NEGATIVE 08/18/2019 1556   NITRITE NEGATIVE 08/18/2019 1556   LEUKOCYTESUR NEGATIVE 08/18/2019 1556   Sepsis Labs Invalid input(s): PROCALCITONIN,  WBC,  LACTICIDVEN Microbiology No results found for this or any previous visit (from the past 240 hour(s)).  Procedures/Studies: DG Forearm Left  Result Date: 08/13/2019 CLINICAL DATA:  Swelling of the left forearm. IV drug user. EXAM: LEFT FOREARM - 2 VIEW COMPARISON:  None. FINDINGS: The bones appear normal. Diffuse soft tissue swelling of the proximal forearm. No visible gas in the soft tissues. IMPRESSION: Soft tissue swelling. Otherwise, normal exam. No visible gas in the soft tissues. Electronically Signed   By: Francene BoyersJames  Maxwell M.D.   On: 08/13/2019 10:59   DG Ankle 2 Views Left  Result Date: 08/19/2019 CLINICAL DATA:  Left ankle swelling.  EXAM: LEFT ANKLE - 2 VIEW COMPARISON:  None. FINDINGS: There is no evidence of fracture, dislocation, or joint effusion. There is no evidence of arthropathy or other focal bone abnormality. Soft tissues are unremarkable. IMPRESSION: Negative. Electronically Signed   By: Lupita RaiderJames  Green Jr M.D.   On: 08/19/2019 18:33   DG Chest Portable 1 View  Result Date: 08/18/2019 CLINICAL DATA:  Sepsis. EXAM: PORTABLE CHEST 1 VIEW COMPARISON:  None. FINDINGS: The heart size and mediastinal contours are within normal limits. Both lungs are clear. The visualized skeletal structures are unremarkable. IMPRESSION: No active disease. Electronically Signed   By: Lupita RaiderJames  Green Jr M.D.   On: 08/18/2019 15:45   DG Foot Complete Left  Result Date: 08/13/2019 CLINICAL DATA:  Left foot swelling EXAM: LEFT FOOT - COMPLETE 3+ VIEW COMPARISON:  None. FINDINGS: No fracture or dislocation of the left foot. Joint spaces are well preserved. Diffuse soft tissue edema about the foot, particularly the dorsum. IMPRESSION: No acute bony abnormality. Soft tissue edema about the foot, particularly the dorsum of the foot. Electronically Signed   By: Lauralyn PrimesAlex  Bibbey M.D.   On: 08/13/2019 11:01   ECHOCARDIOGRAM COMPLETE  Result Date: 08/13/2019   ECHOCARDIOGRAM REPORT   Patient Name:   Seth HaiJAMES Mcgrory Date of Exam: 08/13/2019 Medical Rec #:  [id number]  Height:       72.0 in Accession #:    [id number]  Weight:       180.0 lb Date of Birth:  12-Aug-1991  BSA:          2.04 m Patient Age:    28 years    BP:           125/82 mmHg Patient Gender: M           HR:           86 bpm. Exam Location:  Inpatient Procedure: 2D Echo Indications:    tachycardia  History:        Patient has no prior history of Echocardiogram examinations.                 Sepsis; Risk Factors:IV drug use.  Sonographer:    Delcie Roch Referring Phys: [phone] Currie KIM IMPRESSIONS  1. Left ventricular ejection fraction, by visual estimation, is 50 to 55%. The left ventricle has normal  function. There is no left ventricular hypertrophy.  2. Global right ventricle has normal systolic function.The right ventricular size is normal.  3. Left atrial size was normal.  4. Right atrial size was normal.  5. The mitral valve is normal in structure. Trace mitral valve regurgitation. No evidence of mitral stenosis.  6. The tricuspid valve is normal in structure. Tricuspid valve regurgitation is trivial.  7. The aortic valve is tricuspid. Aortic valve regurgitation is not visualized. No evidence of aortic valve sclerosis or stenosis.  8. The pulmonic valve was normal in structure. Pulmonic valve regurgitation is not visualized.  9. The inferior vena cava is normal in size with greater than 50% respiratory variability, suggesting right atrial pressure of 3 mmHg. 10. Normal LV function; trace MR and TR. FINDINGS  Left Ventricle: Left ventricular ejection fraction, by visual estimation, is 50 to 55%. The left ventricle has normal function. There is no left ventricular hypertrophy. Left ventricular diastolic parameters were normal. Normal left atrial pressure. Right Ventricle: The right ventricular size is normal.Global RV systolic function is has normal systolic function. Left Atrium: Left atrial size was normal in size. Right Atrium: Right atrial size was normal in size Pericardium: There is no evidence of pericardial effusion. Mitral Valve: The mitral valve is normal in structure. No evidence of mitral valve stenosis by observation. Trace mitral valve regurgitation. Tricuspid Valve: The tricuspid valve is normal in structure. Tricuspid valve regurgitation is trivial. Aortic Valve: The aortic valve is tricuspid. Aortic valve regurgitation is not visualized. The aortic valve is structurally normal, with no evidence of sclerosis or stenosis. Pulmonic Valve: The pulmonic valve was normal in structure. Pulmonic valve regurgitation is not visualized. Aorta: The aortic root is normal in size and structure. Venous: The  inferior vena cava is normal in size with greater than 50% respiratory variability, suggesting right atrial pressure of 3 mmHg.   LEFT VENTRICLE PLAX 2D LVIDd:         4.90 cm LVIDs:         3.90 cm LV PW:         1.10 cm LV IVS:        1.00 cm LVOT diam:     2.20 cm LV SV:         47 ml LV SV Index:   22.97 LVOT Area:     3.80 cm  RIGHT VENTRICLE RV S prime:     13.50 cm/s TAPSE (M-mode): 2.3 cm LEFT ATRIUM  Index       RIGHT ATRIUM           Index LA diam:        3.50 cm 1.72 cm/m  RA Area:     12.70 cm LA Vol (A2C):   47.0 ml 23.07 ml/m RA Volume:   26.30 ml  12.91 ml/m LA Vol (A4C):   36.5 ml 17.92 ml/m LA Biplane Vol: 41.9 ml 20.57 ml/m  AORTIC VALVE LVOT Vmax:   129.00 cm/s LVOT Vmean:  102.000 cm/s LVOT VTI:    0.268 m  AORTA Ao Root diam: 3.20 cm MITRAL VALVE MV Area (PHT): 3.30 cm             SHUNTS MV PHT:        66.70 msec           Systemic VTI:  0.27 m MV Decel Time: 230 msec             Systemic Diam: 2.20 cm MV E velocity: 87.00 cm/s 103 cm/s MV A velocity: 67.30 cm/s 70.3 cm/s MV E/A ratio:  1.29       1.5  Kirk Ruths MD Electronically signed by Kirk Ruths MD Signature Date/Time: 08/13/2019/9:57:26 AM    Final    VAS [place] UPPER EXTREMITY VENOUS DUPLEX  Result Date: 08/14/2019 UPPER VENOUS STUDY  Indications: Edema, and Pain Risk Factors: IV drug abuse. Anticoagulation: Lovenox. Performing Technologist: Darlina Sicilian RDCS  Examination Guidelines: A complete evaluation includes B-mode imaging, spectral Doppler, color Doppler, and power Doppler as needed of all accessible portions of each vessel. Bilateral testing is considered an integral part of a complete examination. Limited examinations for reoccurring indications may be performed as noted.  Right Findings: +----------+------------+---------+-----------+----------+---------------------+ RIGHT     CompressiblePhasicitySpontaneousProperties       Summary         +----------+------------+---------+-----------+----------+---------------------+ IJV           Full       Yes       Yes                                    +----------+------------+---------+-----------+----------+---------------------+ Subclavian    Full       Yes       Yes                                    +----------+------------+---------+-----------+----------+---------------------+ Axillary                 Yes       Yes                                    +----------+------------+---------+-----------+----------+---------------------+ Brachial                 Yes       Yes                                    +----------+------------+---------+-----------+----------+---------------------+ Radial                   Yes       Yes              only  proximal portion                                                          visualized       +----------+------------+---------+-----------+----------+---------------------+ Ulnar                                                  Not visualized     +----------+------------+---------+-----------+----------+---------------------+ Cephalic                                               Not visualized     +----------+------------+---------+-----------+----------+---------------------+ Basilic                                                Not visualized     +----------+------------+---------+-----------+----------+---------------------+  Left Findings: +----+------------+---------+-----------+----------+-------+ LEFTCompressiblePhasicitySpontaneousPropertiesSummary +----+------------+---------+-----------+----------+-------+ IJV     Full       Yes       Yes                      +----+------------+---------+-----------+----------+-------+  Summary:  Right: This was a limited study. Left arm pain unable to perform compressions. Very limited exam patient unable to turn his left arm.  *See table(s) above for  measurements and observations.  Diagnosing physician: Gretta Began MD Electronically signed by Gretta Began MD on 08/14/2019 at 9:27:40 AM.    Final      Time coordinating discharge: Over 30 minutes  SIGNED:   Alessandra Bevels, MD  [facility] 08/31/2019, 8:08 PM Pager : [phone]

## 2019-09-09 ENCOUNTER — Telehealth: Payer: Self-pay | Admitting: Infectious Diseases

## 2019-09-09 DIAGNOSIS — B20 Human immunodeficiency virus [HIV] disease: Secondary | ICD-10-CM

## 2019-09-09 NOTE — Telephone Encounter (Signed)
HIV 1 RNA Quant (copies/mL)  Date Value  08/19/2019 834,000   CD4 T Cell Abs (/uL)  Date Value  08/18/2019 248 (L)    Patient left AMA prior to results of VL and is not on any medications. Darnelle Maffucci can you forward to [facility] to track him down to get him in clinic for appt?   Thank you

## 2019-09-16 NOTE — Telephone Encounter (Signed)
Sent referral to [facility]. Nadya Hopwood M, RN  

## 2019-10-14 ENCOUNTER — Other Ambulatory Visit: Payer: Self-pay

## 2019-10-14 ENCOUNTER — Emergency Department (HOSPITAL_COMMUNITY): Payer: Self-pay

## 2019-10-14 ENCOUNTER — Emergency Department (HOSPITAL_COMMUNITY)
Admission: EM | Admit: 2019-10-14 | Discharge: 2019-10-14 | Disposition: A | Discharge status: Home / Self Care | Payer: Self-pay | Attending: Emergency Medicine | Admitting: Emergency Medicine

## 2019-10-14 DIAGNOSIS — L03115 Cellulitis of right lower limb: Secondary | ICD-10-CM | POA: Insufficient documentation

## 2019-10-14 DIAGNOSIS — T07XXXA Unspecified multiple injuries, initial encounter: Secondary | ICD-10-CM

## 2019-10-14 DIAGNOSIS — Y939 Activity, unspecified: Secondary | ICD-10-CM | POA: Insufficient documentation

## 2019-10-14 DIAGNOSIS — S0033XA Contusion of nose, initial encounter: Secondary | ICD-10-CM | POA: Insufficient documentation

## 2019-10-14 DIAGNOSIS — Y999 Unspecified external cause status: Secondary | ICD-10-CM | POA: Insufficient documentation

## 2019-10-14 DIAGNOSIS — Z79899 Other long term (current) drug therapy: Secondary | ICD-10-CM | POA: Insufficient documentation

## 2019-10-14 DIAGNOSIS — F17210 Nicotine dependence, cigarettes, uncomplicated: Secondary | ICD-10-CM | POA: Insufficient documentation

## 2019-10-14 DIAGNOSIS — Y929 Unspecified place or not applicable: Secondary | ICD-10-CM | POA: Insufficient documentation

## 2019-10-14 DIAGNOSIS — Z21 Asymptomatic human immunodeficiency virus [HIV] infection status: Secondary | ICD-10-CM | POA: Insufficient documentation

## 2019-10-14 LAB — CBC WITH DIFFERENTIAL/PLATELET
Abs Immature Granulocytes: 0.02 10*3/uL (ref 0.00–0.07)
Basophils Absolute: 0 10*3/uL (ref 0.0–0.1)
Basophils Relative: 0 %
Eosinophils Absolute: 0.1 10*3/uL (ref 0.0–0.5)
Eosinophils Relative: 1 %
HCT: 35.5 % — ABNORMAL LOW (ref 39.0–52.0)
Hemoglobin: 11.6 g/dL — ABNORMAL LOW (ref 13.0–17.0)
Immature Granulocytes: 0 %
Lymphocytes Relative: 20 %
Lymphs Abs: 1.1 10*3/uL (ref 0.7–4.0)
MCH: 24.8 pg — ABNORMAL LOW (ref 26.0–34.0)
MCHC: 32.7 g/dL (ref 30.0–36.0)
MCV: 75.9 fL — ABNORMAL LOW (ref 80.0–100.0)
Monocytes Absolute: 0.6 10*3/uL (ref 0.1–1.0)
Monocytes Relative: 11 %
Neutro Abs: 3.6 10*3/uL (ref 1.7–7.7)
Neutrophils Relative %: 68 %
Platelets: 293 10*3/uL (ref 150–400)
RBC: 4.68 MIL/uL (ref 4.22–5.81)
RDW: 14.3 % (ref 11.5–15.5)
WBC: 5.4 10*3/uL (ref 4.0–10.5)
nRBC: 0 % (ref 0.0–0.2)

## 2019-10-14 LAB — COMPREHENSIVE METABOLIC PANEL
ALT: 16 U/L (ref 0–44)
AST: 24 U/L (ref 15–41)
Albumin: 3.4 g/dL — ABNORMAL LOW (ref 3.5–5.0)
Alkaline Phosphatase: 76 U/L (ref 38–126)
Anion gap: 11 (ref 5–15)
BUN: 13 mg/dL (ref 6–20)
CO2: 22 mmol/L (ref 22–32)
Calcium: 8.8 mg/dL — ABNORMAL LOW (ref 8.9–10.3)
Chloride: 103 mmol/L (ref 98–111)
Creatinine, Ser: 0.67 mg/dL (ref 0.61–1.24)
GFR calc Af Amer: >60 mL/min (ref >60)
GFR calc non Af Amer: >60 mL/min (ref >60)
Glucose, Bld: 87 mg/dL (ref 70–99)
Potassium: 3.6 mmol/L (ref 3.5–5.1)
Sodium: 136 mmol/L (ref 135–145)
Total Bilirubin: 0.9 mg/dL (ref 0.3–1.2)
Total Protein: 8.1 g/dL (ref 6.5–8.1)

## 2019-10-14 MED ORDER — CLINDAMYCIN HCL 150 MG PO CAPS: 450.0000 mg | ORAL_CAPSULE | Freq: Four times a day (QID) | ORAL | 0 refills | Status: AC

## 2019-10-14 MED ORDER — CLINDAMYCIN PHOSPHATE 600 MG/50ML IV SOLN
600.0000 mg | Freq: Once | INTRAVENOUS | Status: AC
  Administered 2019-10-14: 600 mg via INTRAVENOUS
  Filled 2019-10-14: qty 50

## 2019-10-14 NOTE — ED Notes (Signed)
Patient stating he need to pee, when offered a urinal patient states hes cold and just wants to sleep. This nurse instructed patient about using the urinal and it is on his railing. Patient verbalized understanding.
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
## 2019-10-14 NOTE — ED Notes (Signed)
Patient given DC papers and DC instructions explained. Patient states he does not have a ride home, wanted to call his mother. Attetmped x2 and no response. He says she lives in [place].
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
## 2019-10-14 NOTE — ED Notes (Signed)
Patient given a wet towel and encouraged to wipe his bloody face. Patient refused and fell back asleep.
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
## 2019-10-14 NOTE — Progress Notes (Signed)
Patient still unable to get in contact with his mother. CSW also attempted again x2 with no answer. Patient informed that he is up for discharge. Patient stated with frustration "I need to call my mom and get some money for a phone and way home." Patient still denied he had anywhere to go in [place]. CSW also asked about his substance use again, but patient did not give a clear answer on what substances he uses and when asked if he uses a little of everything he stated "yes." CSW provided patient with homeless resources and explained that the [facility] may be able to assist him in getting a hotel room if he is not able to get in contact with his mother anytime soon. CSW also provided with substance use resources and a bus pass. RN to assist patient in getting dressed and discharged. No other needs noted, CSW signing off.   Geralyn Corwin, LCSW Transitions of Care Department [facility] ED [phone]

## 2019-10-18 ENCOUNTER — Ambulatory Visit: Payer: Self-pay | Admitting: Family

## 2019-11-17 ENCOUNTER — Other Ambulatory Visit: Payer: Self-pay

## 2019-11-17 ENCOUNTER — Ambulatory Visit: Payer: Self-pay

## 2019-11-17 ENCOUNTER — Telehealth: Payer: Self-pay | Admitting: *Deleted

## 2019-11-17 ENCOUNTER — Other Ambulatory Visit: Payer: Self-pay | Admitting: *Deleted

## 2019-11-17 ENCOUNTER — Encounter: Payer: Self-pay | Admitting: Infectious Diseases

## 2019-11-17 DIAGNOSIS — Z113 Encounter for screening for infections with a predominantly sexual mode of transmission: Secondary | ICD-10-CM

## 2019-11-17 DIAGNOSIS — Z79899 Other long term (current) drug therapy: Secondary | ICD-10-CM

## 2019-11-17 DIAGNOSIS — B20 Human immunodeficiency virus [HIV] disease: Secondary | ICD-10-CM

## 2019-11-17 NOTE — Telephone Encounter (Signed)
RN met with patient between Financial Counseling and lab draws. Introduced our clinic, assessed his knowledge and understanding of his diagnosis, discussed PReP/Partner testing options for his girlfriend, answered questions to his satisfaction.   Landis Gandy, RN

## 2019-11-18 LAB — T-HELPER CELL (CD4) - (RCID CLINIC ONLY)
CD4 % Helper T Cell: 29 % — ABNORMAL LOW (ref 33–65)
CD4 T Cell Abs: 327 /uL — ABNORMAL LOW (ref 400–1790)

## 2019-11-30 LAB — LIPID PANEL
Cholesterol: 167 mg/dL (ref <200)
HDL: 37 mg/dL — ABNORMAL LOW (ref >=40)
LDL Cholesterol (Calc): 105 mg/dL (calc) — ABNORMAL HIGH
Non-HDL Cholesterol (Calc): 130 mg/dL (calc) — ABNORMAL HIGH (ref <130)
Total CHOL/HDL Ratio: 4.5 (calc) (ref <5.0)
Triglycerides: 140 mg/dL (ref <150)

## 2019-11-30 LAB — QUANTIFERON-TB GOLD PLUS
Mitogen-NIL: 6.11 IU/mL
NIL: 0.03 IU/mL
QuantiFERON-TB Gold Plus: NEGATIVE
TB1-NIL: <0 IU/mL
TB2-NIL: 0 IU/mL

## 2019-11-30 LAB — HIV ANTIBODY (ROUTINE TESTING W REFLEX): HIV 1&2 Ab, 4th Generation: REACTIVE — AB

## 2019-11-30 LAB — HIV-1 GENOTYPE: HIV-1 Genotype: DETECTED — AB

## 2019-11-30 LAB — COMPLETE METABOLIC PANEL WITH GFR
AG Ratio: 1.1 (calc) (ref 1.0–2.5)
ALT: 24 U/L (ref 9–46)
AST: 21 U/L (ref 10–40)
Albumin: 4.2 g/dL (ref 3.6–5.1)
Alkaline phosphatase (APISO): 97 U/L (ref 36–130)
BUN: 12 mg/dL (ref 7–25)
CO2: 29 mmol/L (ref 20–32)
Calcium: 9.5 mg/dL (ref 8.6–10.3)
Chloride: 99 mmol/L (ref 98–110)
Creat: 0.73 mg/dL (ref 0.60–1.35)
GFR, Est African American: 145 mL/min/{1.73_m2} (ref >=60)
GFR, Est Non African American: 125 mL/min/{1.73_m2} (ref >=60)
Globulin: 3.9 g/dL (calc) — ABNORMAL HIGH (ref 1.9–3.7)
Glucose, Bld: 82 mg/dL (ref 65–99)
Potassium: 3.9 mmol/L (ref 3.5–5.3)
Sodium: 135 mmol/L (ref 135–146)
Total Bilirubin: 0.5 mg/dL (ref 0.2–1.2)
Total Protein: 8.1 g/dL (ref 6.1–8.1)

## 2019-11-30 LAB — HEPATITIS B CORE ANTIBODY, TOTAL: Hep B Core Total Ab: NONREACTIVE

## 2019-11-30 LAB — CBC WITH DIFFERENTIAL/PLATELET
Absolute Monocytes: 361 cells/uL (ref 200–950)
Basophils Absolute: 22 cells/uL (ref 0–200)
Basophils Relative: 0.5 %
Eosinophils Absolute: 172 cells/uL (ref 15–500)
Eosinophils Relative: 3.9 %
HCT: 37.2 % — ABNORMAL LOW (ref 38.5–50.0)
Hemoglobin: 12.2 g/dL — ABNORMAL LOW (ref 13.2–17.1)
Lymphs Abs: 1091 cells/uL (ref 850–3900)
MCH: 24.5 pg — ABNORMAL LOW (ref 27.0–33.0)
MCHC: 32.8 g/dL (ref 32.0–36.0)
MCV: 74.7 fL — ABNORMAL LOW (ref 80.0–100.0)
MPV: 9.4 fL (ref 7.5–12.5)
Monocytes Relative: 8.2 %
Neutro Abs: 2754 cells/uL (ref 1500–7800)
Neutrophils Relative %: 62.6 %
Platelets: 221 10*3/uL (ref 140–400)
RBC: 4.98 10*6/uL (ref 4.20–5.80)
RDW: 15.6 % — ABNORMAL HIGH (ref 11.0–15.0)
Total Lymphocyte: 24.8 %
WBC: 4.4 10*3/uL (ref 3.8–10.8)

## 2019-11-30 LAB — HCV RNA,QUANTITATIVE REAL TIME PCR
HCV Quantitative Log: <1.18 Log IU/mL
HCV RNA, PCR, QN: <15 IU/mL

## 2019-11-30 LAB — HIV-1 RNA ULTRAQUANT REFLEX TO GENTYP+
HIV 1 RNA Quant: 423000 copies/mL — ABNORMAL HIGH
HIV-1 RNA Quant, Log: 5.63 Log copies/mL — ABNORMAL HIGH

## 2019-11-30 LAB — RPR: RPR Ser Ql: NONREACTIVE

## 2019-11-30 LAB — HEPATITIS B SURFACE ANTIBODY,QUALITATIVE: Hep B S Ab: REACTIVE — AB

## 2019-11-30 LAB — HEPATITIS C ANTIBODY
Hepatitis C Ab: REACTIVE — AB
SIGNAL TO CUT-OFF: 28 — ABNORMAL HIGH (ref <1.00)

## 2019-11-30 LAB — HIV-1/2 AB - DIFFERENTIATION
HIV-1 antibody: POSITIVE — AB
HIV-2 Ab: NEGATIVE

## 2019-11-30 LAB — HEPATITIS B SURFACE ANTIGEN: Hepatitis B Surface Ag: NONREACTIVE

## 2019-11-30 LAB — HLA B*5701: HLA-B*5701 w/rflx HLA-B High: NEGATIVE

## 2019-11-30 LAB — HEPATITIS A ANTIBODY, TOTAL: Hepatitis A AB,Total: REACTIVE — AB

## 2019-12-02 ENCOUNTER — Encounter: Payer: Self-pay | Admitting: Infectious Diseases

## 2019-12-02 ENCOUNTER — Ambulatory Visit: Payer: Self-pay | Admitting: Pharmacist

## 2019-12-22 ENCOUNTER — Ambulatory Visit (INDEPENDENT_AMBULATORY_CARE_PROVIDER_SITE_OTHER): Payer: Self-pay | Admitting: Pharmacist

## 2019-12-22 ENCOUNTER — Encounter: Payer: Self-pay | Admitting: Infectious Diseases

## 2019-12-22 ENCOUNTER — Ambulatory Visit (INDEPENDENT_AMBULATORY_CARE_PROVIDER_SITE_OTHER): Payer: Self-pay | Admitting: Infectious Diseases

## 2019-12-22 ENCOUNTER — Other Ambulatory Visit: Payer: Self-pay

## 2019-12-22 ENCOUNTER — Telehealth: Payer: Self-pay | Admitting: Pharmacy Technician

## 2019-12-22 DIAGNOSIS — Z0000 Encounter for general adult medical examination without abnormal findings: Secondary | ICD-10-CM

## 2019-12-22 DIAGNOSIS — B955 Unspecified streptococcus as the cause of diseases classified elsewhere: Secondary | ICD-10-CM

## 2019-12-22 DIAGNOSIS — R7881 Bacteremia: Secondary | ICD-10-CM

## 2019-12-22 DIAGNOSIS — F1990 Other psychoactive substance use, unspecified, uncomplicated: Secondary | ICD-10-CM

## 2019-12-22 DIAGNOSIS — F1920 Other psychoactive substance dependence, uncomplicated: Secondary | ICD-10-CM

## 2019-12-22 DIAGNOSIS — B20 Human immunodeficiency virus [HIV] disease: Secondary | ICD-10-CM

## 2019-12-22 DIAGNOSIS — Z8619 Personal history of other infectious and parasitic diseases: Secondary | ICD-10-CM

## 2019-12-22 MED ORDER — BIKTARVY 50-200-25 MG PO TABS: 1.0000 | ORAL_TABLET | Freq: Every day | ORAL | 11 refills | Status: AC

## 2019-12-22 NOTE — Patient Instructions (Addendum)
Colace for your constipation.  Increase your water intake to help.  Fiber will also help - veggies, fruits or fiber additives (Fiber Gummies, Benefiber)   For your nose try Ocean Saline Rinse a few times a day to keep the area from drying out and to clear out some of the drainage. May need to send you to ENT to consider options for repair.  Biktarvy is the pill I would like for you to start taking to treat you - this will need to be taken once a day around the same time.  - Common side effects for a short time frame usually include headaches, nausea and diarrhea - OK to take over the counter tylenol for headaches and imodium for diarrhea - Try taking with food if you are nauseated  - If you take any multivitamins or supplements please separate them from your Biktarvy by 6 hours before and after.  The main thing is do not have them in the stomach at the same time.  Will like to see you back in 4 weeks with our pharmacy team so we can see how you are doing on your new medication.   For [place] and help with quitting -  [facility] CLINIC: Location: [facility] on [address] of [address] near [place].  Clinic Hours: Wednesdays 2 - 5 pm, Thursdays 3 - 8 pm  One thing at a time - looking forward to working with you and getting you healthy!

## 2019-12-22 NOTE — Progress Notes (Signed)
Name: Seth Brooks  DOB: 12-05-1990 MRN: [id number] PCP: Patient, No Pcp Per    Patient Active Problem List   Diagnosis Date Noted  . Injection of illicit drug within last 12 months 12/25/2019  . Polysubstance dependence ([place]) 12/25/2019  . Healthcare maintenance 12/25/2019  . History of hepatitis C 12/25/2019  . HIV (human immunodeficiency virus infection) ([place]) 08/19/2019  . Protein-calorie malnutrition, moderate ([place]) 08/13/2019     Brief Narrative:  Seth Brooks is a 29 y.o. male diagnosed with HIV in 08/2019. CD4 nadir 248 VL 423,000 HIV Risk: IVDU, heterosexual contact  History of OIs: none Intake Labs 11/2019: Hep B sAg (-), sAb (+), cAb (-); Hep A (+), Hep C (+) RNA (-) Quantiferon (-) HLA B*5701 (-) G6PD: ()   Previous Regimens: . Naive   Genotypes: . 11-2019: wildtype  Subjective:  CC:  New patient visit for HIV care   HPI: Seth Brooks is a 29 y.o. male entering care for recently diagnosed HIV infection.   He was hospitalized in December 2020 with skin abscesses involving his hands and secondary bacteremia with group A streptococcus. He received HIV testing per routine recommendations  Was tested previously around 1 year prior to diagnosis and non-reactive at that time. Risk factors include injection drug use and multiple sexual partners.  He recalls he was very ill in late November with viral like illness that worsened with acute cellulitis / sepsis from GAS and soft tissue infection from injection. He lost at least 20 lbs during that time frame. Did recall a sore throat at one point. His appetite is improved now and believes he has gained some weight.   He has a history of hepatitis C infection from previous screening but never treatment or assessment for treatment. His RNA is negative with reflex from positive antibody. He lives in [place], [place] and has access to private transportation.   He has never really tried with intentions to quit despite a few  historical attempts with rehab. He would like to quit using drugs in the near future. He does not share needles but does re-use them at times. Obtains clean supplies from Pharmacy. Uses methamphetamines and heroin daily. Does not drink alcohol much. Smokes cigarettes and would like to one day quit but not now.   Partner is here with him today. She has been feeling poorly lately and would like to be partner tested. They use drugs together and sexually active, no barrier protection used.    Review of Systems  Constitutional: Positive for malaise/fatigue and weight loss. Negative for chills and fever.  HENT: Negative for tinnitus.   Eyes: Negative for blurred vision and photophobia.  Respiratory: Negative for cough and sputum production.   Cardiovascular: Negative for chest pain.  Gastrointestinal: Negative for diarrhea, nausea and vomiting.  Genitourinary: Negative for dysuria.  Musculoskeletal: Negative for myalgias.  Skin: Negative for rash.  Neurological: Negative for weakness and headaches.  Psychiatric/Behavioral: Positive for substance abuse.    Past Medical History:  Diagnosis Date  . History of hepatitis C 12/25/2019   Cleared through natural immune function without DAA. RNA (-) 11-2019 Needs annual screening with ongoing risk.   Marland Kitchen HIV (human immunodeficiency virus infection) ([place]) 08/19/2019  . IV drug user     Outpatient Medications Prior to Visit  Medication Sig Dispense Refill  . doxycycline (VIBRAMYCIN) 100 MG capsule Take 1 capsule (100 mg total) by mouth 2 (two) times daily. (Patient not taking: Reported on 12/22/2019) 20 capsule 0  No facility-administered medications prior to visit.     No Known Allergies  Social History   Tobacco Use  . Smoking status: Current Every Day Smoker    Packs/day: 0.50    Types: Cigarettes  . Smokeless tobacco: Never Used  Substance Use Topics  . Alcohol use: Not Currently  . Drug use: Yes    Types: IV, Heroin, Methamphetamines     Family History  Problem Relation Age of Onset  . Cancer Father     Social History   Substance and Sexual Activity  Sexual Activity Yes  . Partners: Female  . Birth control/protection: Condom   Comment: given condoms     Objective:   Vitals:   12/22/19 1419  BP: 123/85  Pulse: 85  Temp: 98 F (36.7 C)  TempSrc: Oral  SpO2: 99%  Weight: 183 lb (83 kg)  Height: 6' (1.829 m)   Body mass index is 24.82 kg/m.  Physical Exam Vitals reviewed.  HENT:     Mouth/Throat:     Mouth: No oral lesions.     Dentition: Normal dentition. No dental caries.  Eyes:     General: No scleral icterus. Cardiovascular:     Rate and Rhythm: Normal rate and regular rhythm.     Heart sounds: Normal heart sounds.  Pulmonary:     Effort: Pulmonary effort is normal.     Breath sounds: Normal breath sounds.  Abdominal:     General: There is no distension.     Palpations: Abdomen is soft.     Tenderness: There is no abdominal tenderness.  Musculoskeletal:        General: Normal range of motion.  Lymphadenopathy:     Cervical: No cervical adenopathy.  Skin:    General: Skin is warm and dry.     Findings: No rash.     Comments: No active skin infections observed on arms/hands. Healed injection scars.  Few scattered area of scabbed papules that are not infected but itchy.   Neurological:     Mental Status: He is alert and oriented to person, place, and time.     Lab Results Lab Results  Component Value Date   WBC 4.4 11/17/2019   HGB 12.2 (L) 11/17/2019   HCT 37.2 (L) 11/17/2019   MCV 74.7 (L) 11/17/2019   PLT 221 11/17/2019    Lab Results  Component Value Date   CREATININE 0.73 11/17/2019   BUN 12 11/17/2019   NA 135 11/17/2019   K 3.9 11/17/2019   CL 99 11/17/2019   CO2 29 11/17/2019    Lab Results  Component Value Date   ALT 24 11/17/2019   AST 21 11/17/2019   ALKPHOS 76 10/14/2019   BILITOT 0.5 11/17/2019    Lab Results  Component Value Date   CHOL 167  11/17/2019   HDL 37 (L) 11/17/2019   LDLCALC 105 (H) 11/17/2019   TRIG 140 11/17/2019   CHOLHDL 4.5 11/17/2019   HIV 1 RNA Quant (copies/mL)  Date Value  11/17/2019 423,000 (H)  08/19/2019 834,000   CD4 T Cell Abs (/uL)  Date Value  11/17/2019 327 (L)  08/18/2019 248 (L)     Assessment & Plan:   Problem List Items Addressed This Visit      Unprioritized   RESOLVED: Streptococcal bacteremia    Resolved following drainage of abscess and short course antibiotics.       Polysubstance dependence ([place])    Provided resources for [facility] to obtain clean  supplies. Offered support to quit - he is pre-contemplative currently.       Injection of illicit drug within last 12 months   HIV (human immunodeficiency virus infection) ([place]) (Chronic)    New patient here to establish for HIV care. He is naive to treatment but is ready to start. He does not feel like his drug use will get in the way of daily pill requirement.  Risk for acquisition / transmission of HIV includes unprotected sex and injection drug use.   I discussed with Lynelle Smoke treatment options/side effects, benefits of treatment and long-term outcomes. I discussed how HIV is transmitted and the process of untreated HIV including increased risk for opportunistic infections, cancer, dementia and renal failure. Patient was counseled on routine HIV care including medication adherence, blood monitoring, necessary vaccines and follow up visits. Counseled regarding safe sex practices including: condom use, partner disclosure, limiting partners. Patient spent time talking with our pharmacist Cassie regarding successful practices of ART and understands to reach out to our clinic in the future with questions.   Will start [place] for HIV treatment. His ADAP is approved and will receive from [facility].   General introduction to our clinic and integrated services. Offered support with [facility] for  drug addiction - declined at this time but will consider in the future. Offered THP assistance today. Will need dental referral in the future - exam benign today.   Precautions discussed for abstinance or barrier method to prevent HIV sexual transmission and related to injection use.   I spent greater than 60 minutes with the patient today. Greater than 50% of the time spent face-to-face counseling and coordination of care re: HIV and health maintenance.        Relevant Medications   bictegravir-emtricitabine-tenofovir AF (BIKTARVY) 50-200-25 MG TABS tablet   History of hepatitis C    He is immune to hep b and a. RNA recently negative in December and again in March 2021. Will need re-screening with RNA annually to monitor for new infection given ongoing risk.       Relevant Medications   bictegravir-emtricitabine-tenofovir AF (BIKTARVY) 50-200-25 MG TABS tablet   Healthcare maintenance    He is immune to hep b and a. Will proceed with pneumonia vaccine series and menveo at upcoming appointments.         RTC in 4 weeks to assess adherence, tolerability and repeat VL.   Janene Madeira, MSN, NP-C [facility] [place] Pager: [phone] Office: [phone]

## 2019-12-22 NOTE — Telephone Encounter (Signed)
[facility] Patient Advocate Encounter ° °Insurance verification completed.   ° °The patient is uninsured and will need patient assistance for medication. ° °We can complete the application and will need to meet with the patient for signatures and income documentation. ° °Anand Tejada E. Tameeka Luo, CPhT °Specialty Pharmacy Patient Advocate °[facility] °Phone: [phone] °Fax:  [phone] ° ° °

## 2019-12-22 NOTE — Progress Notes (Signed)
HPI: Seth Brooks is a 29 y.o. male who presents to the [facility] clinic today to initiate care for a newly diagnosed HIV infection.  Patient Active Problem List   Diagnosis Date Noted  . HIV infection (Seth Brooks)   . Streptococcal bacteremia 08/19/2019  . Newly diagnosed HIV-positive (Seth Brooks) 08/19/2019  . Bacteremia 08/18/2019  . Cellulitis 08/13/2019  . Hyponatremia 08/13/2019  . Hypokalemia 08/13/2019  . Protein-calorie malnutrition, moderate (Seth Brooks) 08/13/2019  . Sepsis (Seth Brooks) 08/13/2019    Patient's Medications  New Prescriptions   BICTEGRAVIR-EMTRICITABINE-TENOFOVIR AF (BIKTARVY) 50-200-25 MG TABS TABLET    Take 1 tablet by mouth daily.  Previous Medications   No medications on file  Modified Medications   No medications on file  Discontinued Medications   No medications on file    Allergies: No Known Allergies  Past Medical History: Past Medical History:  Diagnosis Date  . IV drug user     Social History: Social History   Socioeconomic History  . Marital status: Single    Spouse name: Not on file  . Number of children: Not on file  . Years of education: Not on file  . Highest education level: Not on file  Occupational History  . Not on file  Tobacco Use  . Smoking status: Current Every Day Smoker    Packs/day: 0.50    Types: Cigarettes  . Smokeless tobacco: Never Used  Substance and Sexual Activity  . Alcohol use: Not Currently  . Drug use: Yes    Types: IV, Heroin, Methamphetamines  . Sexual activity: Yes    Partners: Female    Birth control/protection: Condom    Comment: given condoms  Other Topics Concern  . Not on file  Social History Narrative  . Not on file   Social Determinants of Health   Financial Resource Strain:   . Difficulty of Paying Living Expenses:   Food Insecurity:   . Worried About Charity fundraiser in the Last Year:   . Arboriculturist in the Last Year:   Transportation Needs:   . Film/video editor (Medical):   Marland Kitchen Lack of  Transportation (Non-Medical):   Physical Activity:   . Days of Exercise per Week:   . Minutes of Exercise per Session:   Stress:   . Feeling of Stress :   Social Connections:   . Frequency of Communication with Friends and Family:   . Frequency of Social Gatherings with Friends and Family:   . Attends Religious Services:   . Active Member of Clubs or Organizations:   . Attends Archivist Meetings:   Marland Kitchen Marital Status:     Labs: Lab Results  Component Value Date   HIV1RNAQUANT 423,000 (H) 11/17/2019   HIV1RNAQUANT 834,000 08/19/2019   CD4TABS 327 (L) 11/17/2019   CD4TABS 248 (L) 08/18/2019    RPR and STI Lab Results  Component Value Date   LABRPR NON-REACTIVE 11/17/2019   LABRPR NON REACTIVE 08/19/2019    No flowsheet data found.  Hepatitis B Lab Results  Component Value Date   HEPBSAB REACTIVE (A) 11/17/2019   HEPBSAG NON-REACTIVE 11/17/2019   HEPBCAB NON-REACTIVE 11/17/2019   Hepatitis C Lab Results  Component Value Date   HEPCAB REACTIVE (A) 11/17/2019   HCVRNAPCRQN <15 NOT DETECTED 11/17/2019   Hepatitis A Lab Results  Component Value Date   HAV REACTIVE (A) 11/17/2019   Lipids: Lab Results  Component Value Date   CHOL 167 11/17/2019   TRIG 140 11/17/2019  HDL 37 (L) 11/17/2019   CHOLHDL 4.5 11/17/2019   LDLCALC 105 (H) 11/17/2019    Current HIV Regimen: Treatment naive  Assessment: Seth Brooks is here today to initiate care with Seth Brooks for his newly diagnosed HIV infection.  He is treatment naive with an initial HIV viral load of 423,000 and a CD4 count of 327 on 11/17/19.  No resistance mutations found on initial genotype. Will start patient on Biktarvy.  Explained that Seth Brooks is a one pill once daily medication with or without food and the importance of not missing any doses. Explained resistance and how it develops and why it is so important to take Biktarvy daily and not skip days or doses. Counseled patient to take it around the same  time each day. Counseled on what to do if dose is missed, if closer to missed dose take immediately, if closer to next dose then skip and resume normal schedule.   Cautioned on possible side effects the first week or so including nausea, diarrhea, dizziness, and headaches but that they should resolve after the first couple of weeks.I reviewed patient medications and found no drug interactions. Counseled patient to separate Biktarvy from divalent cations including multivitamins. Discussed with patient to call clinic if he starts a new medication or herbal supplement. Seth Brooks gave the patient her card and told him to call with any issues/questions/concerns.  Plan: - Start Biktarvy PO once daily   Seth Brooks PharmD Candidate 12/22/2019, 3:22 PM

## 2019-12-25 ENCOUNTER — Encounter: Payer: Self-pay | Admitting: Infectious Diseases

## 2019-12-25 DIAGNOSIS — F1920 Other psychoactive substance dependence, uncomplicated: Secondary | ICD-10-CM | POA: Insufficient documentation

## 2019-12-25 DIAGNOSIS — F1990 Other psychoactive substance use, unspecified, uncomplicated: Secondary | ICD-10-CM | POA: Insufficient documentation

## 2019-12-25 DIAGNOSIS — Z8619 Personal history of other infectious and parasitic diseases: Secondary | ICD-10-CM

## 2019-12-25 DIAGNOSIS — Z0000 Encounter for general adult medical examination without abnormal findings: Secondary | ICD-10-CM | POA: Insufficient documentation

## 2019-12-25 HISTORY — DX: Personal history of other infectious and parasitic diseases: Z86.19

## 2019-12-25 NOTE — Assessment & Plan Note (Addendum)
New patient here to establish for HIV care. He is naive to treatment but is ready to start. He does not feel like his drug use will get in the way of daily pill requirement.  Risk for acquisition / transmission of HIV includes unprotected sex and injection drug use.   I discussed with Seth Brooks treatment options/side effects, benefits of treatment and long-term outcomes. I discussed how HIV is transmitted and the process of untreated HIV including increased risk for opportunistic infections, cancer, dementia and renal failure. Patient was counseled on routine HIV care including medication adherence, blood monitoring, necessary vaccines and follow up visits. Counseled regarding safe sex practices including: condom use, partner disclosure, limiting partners. Patient spent time talking with our pharmacist Cassie regarding successful practices of ART and understands to reach out to our clinic in the future with questions.   Will start BIKTARVY for HIV treatment. His ADAP is approved and will receive from [facility] specialty.   General introduction to our clinic and integrated services. Offered support with [facility] for drug addiction - declined at this time but will consider in the future. Offered THP assistance today. Will need dental referral in the future - exam benign today.   Precautions discussed for abstinance or barrier method to prevent HIV sexual transmission and related to injection use.   I spent greater than 60 minutes with the patient today. Greater than 50% of the time spent face-to-face counseling and coordination of care re: HIV and health maintenance.

## 2019-12-25 NOTE — Assessment & Plan Note (Signed)
He is immune to hep b and a. RNA recently negative in December and again in March 2021. Will need re-screening with RNA annually to monitor for new infection given ongoing risk.

## 2019-12-25 NOTE — Assessment & Plan Note (Signed)
Provided resources for [facility] to obtain clean supplies. Offered support to quit - he is pre-contemplative currently.

## 2019-12-25 NOTE — Assessment & Plan Note (Signed)
He is immune to hep b and a. Will proceed with pneumonia vaccine series and menveo at upcoming appointments.

## 2019-12-25 NOTE — Assessment & Plan Note (Signed)
Resolved following drainage of abscess and short course antibiotics.

## 2020-01-20 ENCOUNTER — Ambulatory Visit: Payer: Self-pay | Admitting: Infectious Diseases

## 2020-01-21 ENCOUNTER — Ambulatory Visit (INDEPENDENT_AMBULATORY_CARE_PROVIDER_SITE_OTHER): Payer: Self-pay | Admitting: Infectious Diseases

## 2020-01-21 ENCOUNTER — Encounter: Payer: Self-pay | Admitting: Infectious Diseases

## 2020-01-21 ENCOUNTER — Other Ambulatory Visit: Payer: Self-pay

## 2020-01-21 VITALS — BP 143/93 | HR 105 | Temp 98.2°F | Wt 188.0 lb

## 2020-01-21 DIAGNOSIS — Z23 Encounter for immunization: Secondary | ICD-10-CM

## 2020-01-21 DIAGNOSIS — Z8619 Personal history of other infectious and parasitic diseases: Secondary | ICD-10-CM

## 2020-01-21 DIAGNOSIS — Z21 Asymptomatic human immunodeficiency virus [HIV] infection status: Secondary | ICD-10-CM

## 2020-01-21 NOTE — Assessment & Plan Note (Signed)
It sounds like he is doing well early on with his treatment. We reviewed his labs again at entry to care and discussed what we should see as a result of daily consistent medication use. He will have VL drawn today.  Girlfriend is on PrEP - she is (+) Hepatitis C and in care here to address treatment as well.  Prevnar today - pneumovax in 8 weeks if he doesn't get the COVID shot in between visits.

## 2020-01-21 NOTE — Patient Instructions (Addendum)
Please continue your Biktarvy once a day.    Medications that will hopefully help with constipation:   Colace - this is a stool softener taken to help loosen up the bowel movements (this may not be enough for you)  Senna - this is more of a stimulant and will help you achieve a bowel movement more frequently (can take with the Colace if you need to)  Miralax - this is more of a laxative to help you achieve a bowel movement. Mix in liquid once daily as needed.      For your COVID Vaccine -  There are 3 options out currently to consider:  1. Pfizer - 2 doses 3 weeks apart, 95% protective  2. Moderna - 2 doses 4 weeks apart, 95% protective  3. Johnsen & Johnsen - 1 dose, 70% protective   To Schedule at [facility]:  To book an appointment, go to [email or web address]   OR  For the [employer] Site at [facility]:  Call [phone] to schedule your appointment     OR  Text "[email or web address]" to [phone] to get a link to schedule through the [facility] Department.      About Constipation  Constipation Overview Constipation is the most common gastrointestinal complaint -- about 4 million Americans experience constipation and make 2.5 million physician visits a year to get help for the problem.  Constipation can occur when the colon absorbs too much water, the colon's muscle contraction is slow or sluggish, and/or there is delayed transit time through the colon.  The result is stool that is hard and dry.  Indicators of constipation include straining during bowel movements greater than 25% of the time, having fewer than three bowel movements per week, and/or the feeling of incomplete evacuation.  There are established guidelines ([place] II ) for defining constipation. A person needs to have two or more of the following symptoms for at least 12 weeks (not necessarily consecutive) in the preceding 12 months: . Straining in  greater than 25% of bowel  movements . Lumpy or hard stools in greater than 25% of bowel movements . Sensation of incomplete emptying in greater than 25% of bowel movements . Sensation of anorectal obstruction/blockade in greater than 25% of bowel movements . Manual maneuvers to help empty greater than 25% of bowel movements (e.g., digital evacuation, support of the pelvic floor)  . Less than  3 bowel movements/week . Loose stools are not present, and criteria for irritable bowel syndrome are insufficient  Common Causes of Constipation . Lack of fiber in your diet . Lack of physical activity . Medications, including iron and calcium supplements  . Dairy intake . Dehydration . Abuse of laxatives  Travel  Irritable Bowel Syndrome  Pregnancy  Luteal phase of menstruation (after ovulation and before menses)  Colorectal problems  Intestinal Dysfunction  Treating Constipation  There are several ways of treating constipation, including changes to diet and exercise, use of laxatives, adjustments to the pelvic floor, and scheduled toileting.  These treatments include: . increasing fiber and fluids in the diet  . increasing physical activity . learning muscle coordination   learning proper toileting techniques and toileting modifications   designing and sticking  to a toileting schedule     2007, [employer] Doc.22

## 2020-01-21 NOTE — Assessment & Plan Note (Signed)
Discussed again today he was previously infected with hepatitis C but cleared. Will monitor yearly if he continues with ongoing risk. Hopefully he will consider rehab program soon.

## 2020-01-21 NOTE — Progress Notes (Signed)
Name: Seth Brooks  DOB: Oct 15, 1990 MRN: [id number] PCP: Patient, No Pcp Per    Patient Active Problem List   Diagnosis Date Noted  . Injection of illicit drug within last 12 months 12/25/2019  . Polysubstance dependence ([place]) 12/25/2019  . Healthcare maintenance 12/25/2019  . History of hepatitis C 12/25/2019  . HIV (human immunodeficiency virus infection) ([place]) 08/19/2019  . Protein-calorie malnutrition, moderate ([place]) 08/13/2019     Brief Narrative:  Seth Brooks is a 29 y.o. male diagnosed with HIV in 08/2019. CD4 nadir 248 VL 423,000 HIV Risk: IVDU, heterosexual contact  History of OIs: none Intake Labs 11/2019: Hep B sAg (-), sAb (+), cAb (-); Hep A (+), Hep C (+) RNA (-) Quantiferon (-) HLA B*5701 (-) G6PD: ()   Previous Regimens: . Naive   Genotypes: . 11-2019: wildtype  Subjective:  CC:  HIV follow up care  Constipation    HPI: [name] has been doing well over the last month on his Biktarvy. He has only missed one dose last week when he realized he forgot to take it when he went to bed. He has not since missed.   He noticed he has gained about 18 lbs since he last weighed - he is happy with this re-gain as he felt he lost too much weight back in November during acute illness. His appetite is normal and he has no intolerances to food any longer.   Still using heroin daily - contemplating quitting currently and hoping his partner will be ready to soon as well. He does not share needles with her but they are sexually active. She is on PrEP currently.   He is having trouble with constipation - maybe has one BM a week on average and it is difficult to pass.     Review of Systems  Constitutional: Negative for chills, fever, malaise/fatigue and weight loss.  HENT: Negative for tinnitus.   Eyes: Negative for blurred vision and photophobia.  Respiratory: Negative for cough and sputum production.   Cardiovascular: Negative for chest pain.  Gastrointestinal:  Positive for constipation. Negative for diarrhea, nausea and vomiting.  Genitourinary: Negative for dysuria.  Musculoskeletal: Negative for myalgias.  Skin: Negative for rash.  Neurological: Negative for weakness and headaches.  Psychiatric/Behavioral: Positive for substance abuse.    Past Medical History:  Diagnosis Date  . History of hepatitis C 12/25/2019   Cleared through natural immune function without DAA. RNA (-) 11-2019 Needs annual screening with ongoing risk.   Marland Kitchen HIV (human immunodeficiency virus infection) ([place]) 08/19/2019  . IV drug user     Outpatient Medications Prior to Visit  Medication Sig Dispense Refill  . bictegravir-emtricitabine-tenofovir AF (BIKTARVY) 50-200-25 MG TABS tablet Take 1 tablet by mouth daily. 30 tablet 11   No facility-administered medications prior to visit.     No Known Allergies  Social History   Tobacco Use  . Smoking status: Current Every Day Smoker    Packs/day: 0.50    Types: Cigarettes  . Smokeless tobacco: Never Used  Substance Use Topics  . Alcohol use: Not Currently  . Drug use: Yes    Types: IV, Heroin, Methamphetamines    Social History   Substance and Sexual Activity  Sexual Activity Yes  . Partners: Female  . Birth control/protection: Condom   Comment: given condoms     Objective:   Vitals:   01/21/20 1112  BP: (!) 143/93  Pulse: (!) 105  Temp: 98.2 F (36.8 C)  TempSrc: Oral  Weight:  188 lb (85.3 kg)   Body mass index is 25.5 kg/m.   Physical Exam Vitals reviewed.  HENT:     Mouth/Throat:     Mouth: No oral lesions.     Dentition: Normal dentition. No dental caries.  Eyes:     General: No scleral icterus. Cardiovascular:     Rate and Rhythm: Normal rate and regular rhythm.     Heart sounds: Normal heart sounds.  Pulmonary:     Effort: Pulmonary effort is normal.     Breath sounds: Normal breath sounds.  Abdominal:     General: There is no distension.     Palpations: Abdomen is soft.      Tenderness: There is no abdominal tenderness.  Musculoskeletal:        General: Normal range of motion.  Lymphadenopathy:     Cervical: No cervical adenopathy.  Skin:    General: Skin is warm and dry.     Findings: No rash.  Neurological:     Mental Status: He is alert and oriented to person, place, and time.     Lab Results Lab Results  Component Value Date   WBC 4.4 11/17/2019   HGB 12.2 (L) 11/17/2019   HCT 37.2 (L) 11/17/2019   MCV 74.7 (L) 11/17/2019   PLT 221 11/17/2019    Lab Results  Component Value Date   CREATININE 0.73 11/17/2019   BUN 12 11/17/2019   NA 135 11/17/2019   K 3.9 11/17/2019   CL 99 11/17/2019   CO2 29 11/17/2019    Lab Results  Component Value Date   ALT 24 11/17/2019   AST 21 11/17/2019   ALKPHOS 76 10/14/2019   BILITOT 0.5 11/17/2019    Lab Results  Component Value Date   CHOL 167 11/17/2019   HDL 37 (L) 11/17/2019   LDLCALC 105 (H) 11/17/2019   TRIG 140 11/17/2019   CHOLHDL 4.5 11/17/2019   HIV 1 RNA Quant (copies/mL)  Date Value  11/17/2019 423,000 (H)  08/19/2019 834,000   CD4 T Cell Abs (/uL)  Date Value  11/17/2019 327 (L)  08/18/2019 248 (L)     Assessment & Plan:   Problem List Items Addressed This Visit      Unprioritized   HIV (human immunodeficiency virus infection) ([place]) - Primary (Chronic)    It sounds like he is doing well early on with his treatment. We reviewed his labs again at entry to care and discussed what we should see as a result of daily consistent medication use. He will have VL drawn today.  Girlfriend is on PrEP - she is (+) Hepatitis C and in care here to address treatment as well.  Prevnar today - pneumovax in 8 weeks if he doesn't get the COVID shot in between visits.       Relevant Orders   HIV-1 RNA quant-no reflex-bld   History of hepatitis C    Discussed again today he was previously infected with hepatitis C but cleared. Will monitor yearly if he continues with ongoing risk. Hopefully  he will consider rehab program soon.        Other Visit Diagnoses    Need for vaccination against Streptococcus pneumoniae using pneumococcal conjugate vaccine 13       Relevant Orders   Pneumococcal conjugate vaccine 13-valent IM (Completed)     Return in about 3 months (around 04/22/2020).    Janene Madeira, MSN, NP-C [facility] Pager: [phone] Office:  [phone]    

## 2020-01-25 LAB — HIV-1 RNA QUANT-NO REFLEX-BLD
HIV 1 RNA Quant: 186 copies/mL — ABNORMAL HIGH
HIV-1 RNA Quant, Log: 2.27 Log copies/mL — ABNORMAL HIGH

## 2020-02-22 ENCOUNTER — Telehealth: Payer: Self-pay | Admitting: Pharmacy Technician

## 2020-02-22 NOTE — Telephone Encounter (Signed)
[facility] Patient Advocate Encounter  Patient left a voicemail message requesting help with his refill and not understanding why he had to wait. He previously filled and picked up a refill on 02/05/2020 and adap won't allow an early refill until 02/25/2020. Spoke with Rennis Harding at [facility] and he will reach back out to the patient and try and explain waiting until the 11th,

## 2020-04-21 ENCOUNTER — Ambulatory Visit: Payer: Self-pay | Admitting: Infectious Diseases

## 2021-07-20 IMAGING — CT CT HEAD W/O CM
3 series · 15 of 47 positions shown, 18 images · non-contrast
Comparison: Nasal radiography 06/28/2007

CLINICAL DATA: Had not base injury

EXAM:
CT HEAD WITHOUT CONTRAST
CT MAXILLOFACIAL WITHOUT CONTRAST
TECHNIQUE: Multidetector CT imaging of the head and maxillofacial structures
were performed using the standard protocol without intravenous
contrast. Multiplanar CT image reconstructions of the maxillofacial
structures were also generated.

[Series 3: head wo · axial · 0.47mm/px · z∈[+1531,+1661]mm · 9 of 32 slices shown, 12 images]
[im 3/32  brain]
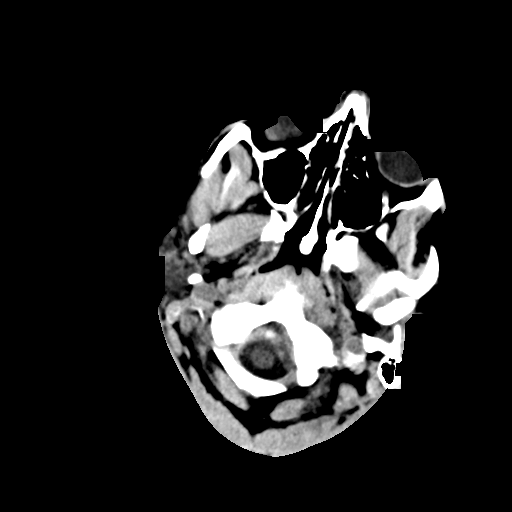
[im 3/32  bone]
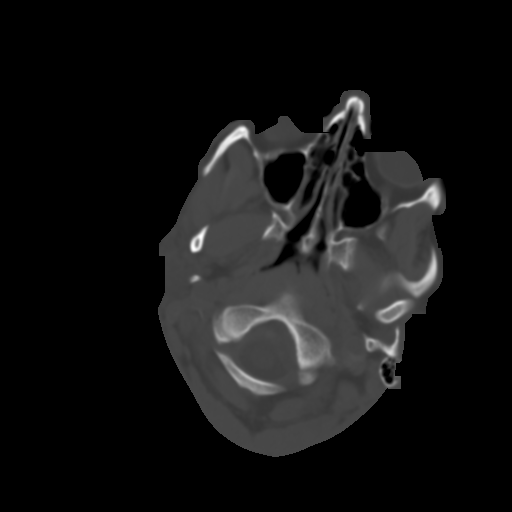
[im 6/32  brain]
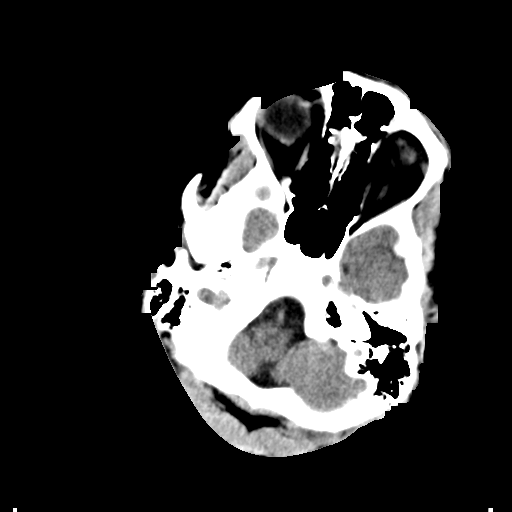
[im 9/32  brain]
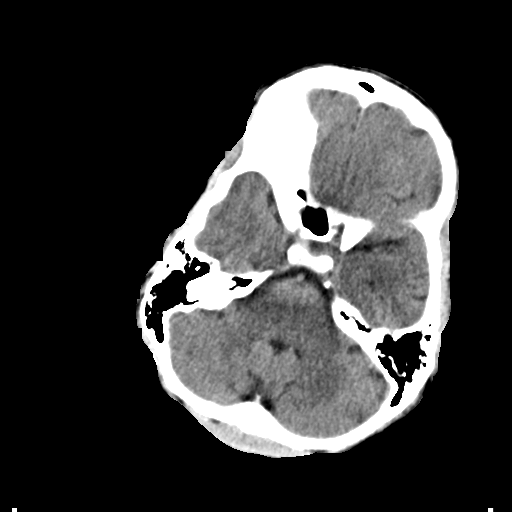
[im 12/32  brain]
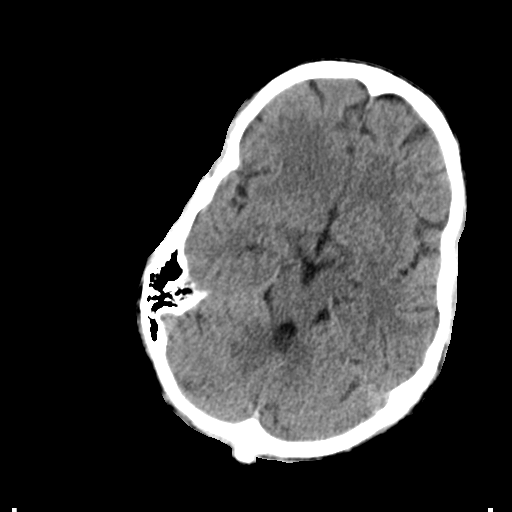
[im 17/32  brain]
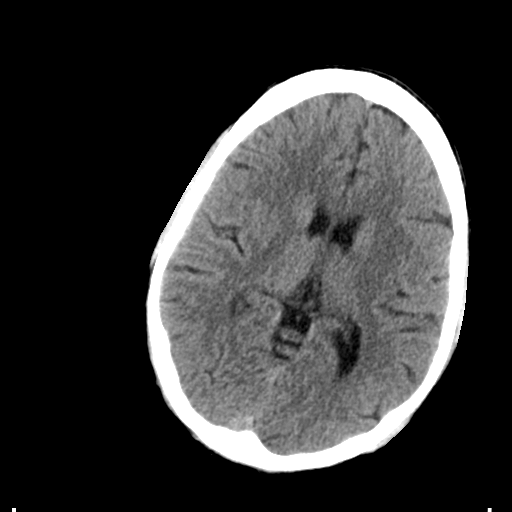
[im 17/32  bone]
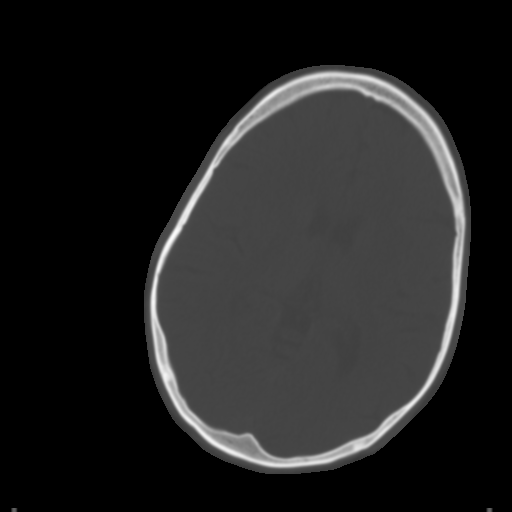
[im 20/32  brain]
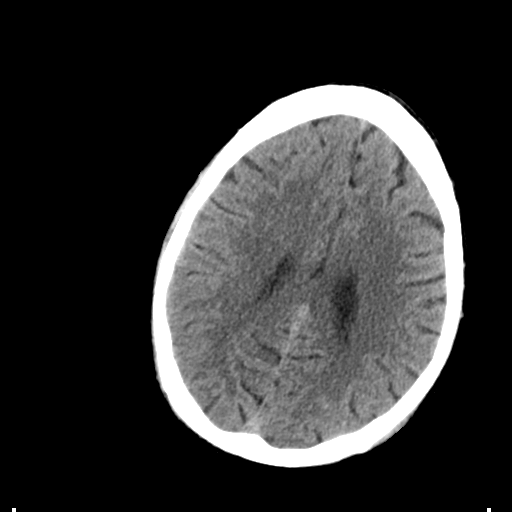
[im 23/32  brain]
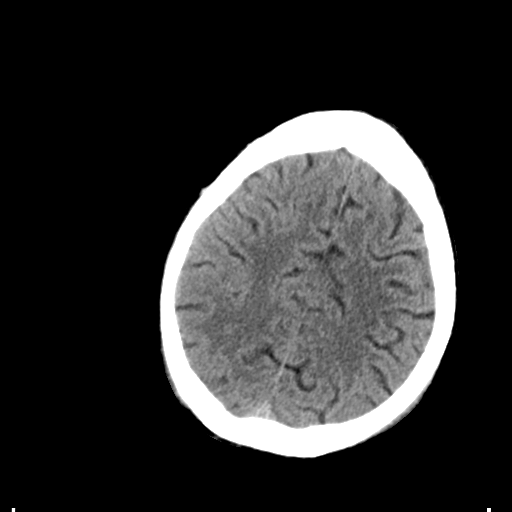
[im 26/32  brain]
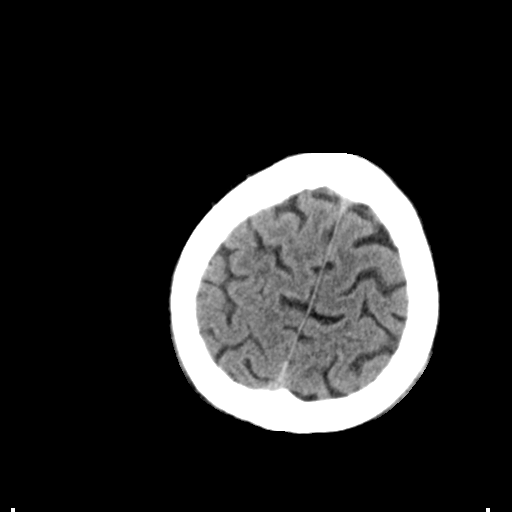
[im 29/32  brain]
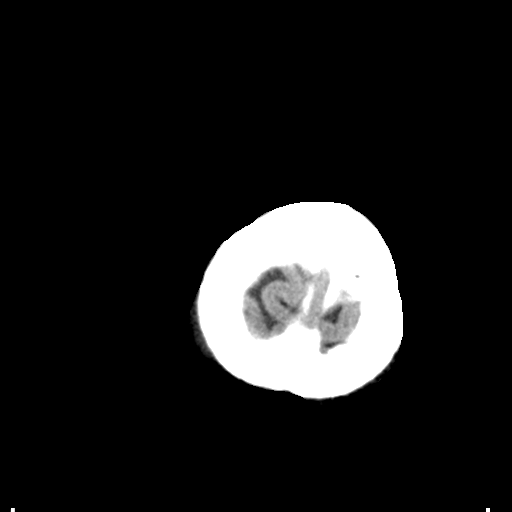
[im 29/32  bone]
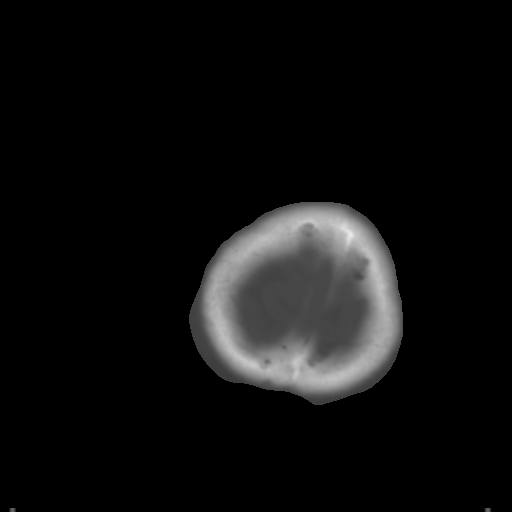

[Series 5: coronal soft tissue · coronal · 0.31mm/px · 3 of 68 slices shown]
[im 23/68  brain]
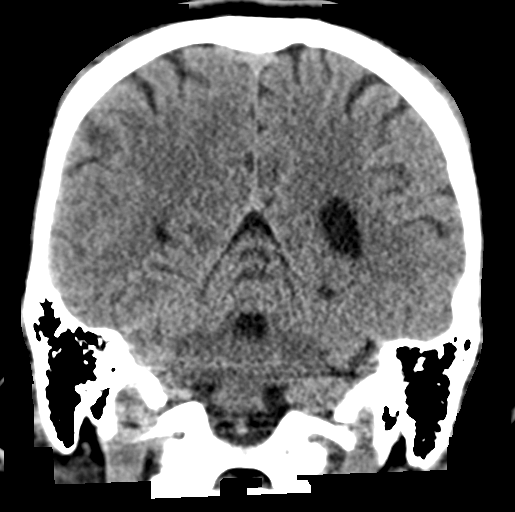
[im 30/68  brain]
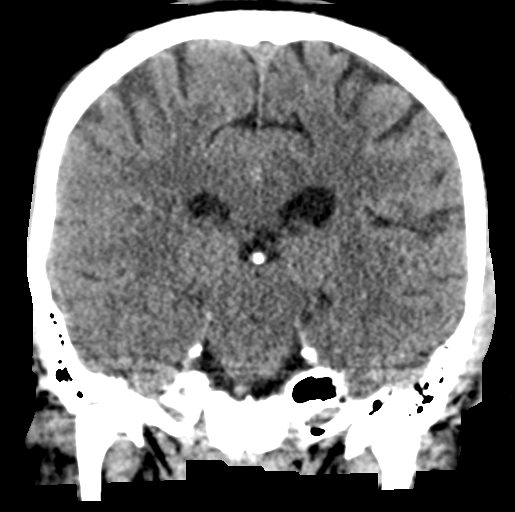
[im 38/68  brain]
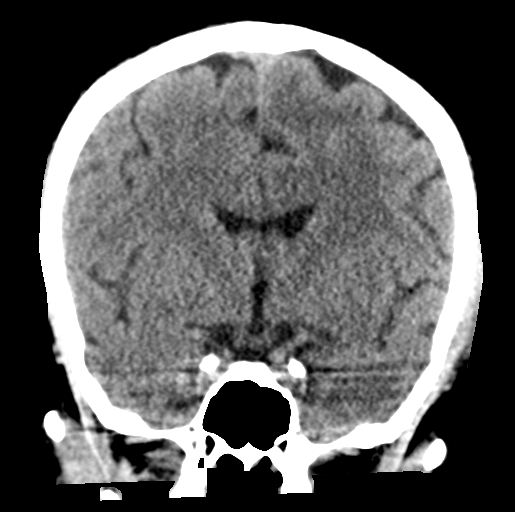

[Series 6: sagittal soft tissue · sagittal · 0.31mm/px · 3 of 52 slices shown]
[im 18/52  brain]
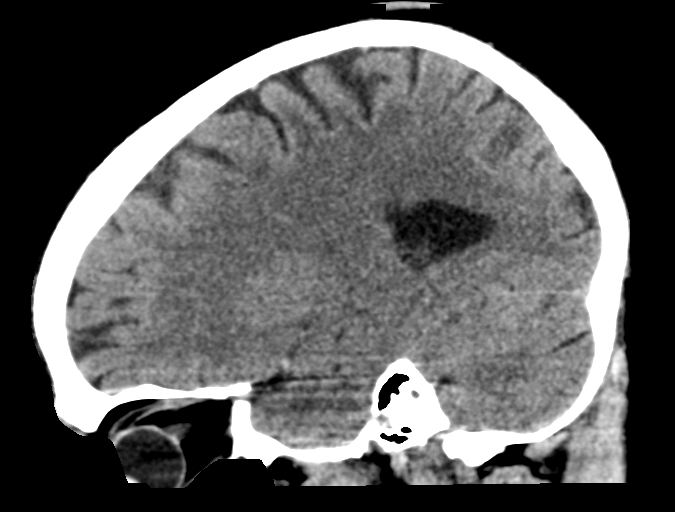
[im 26/52  brain]
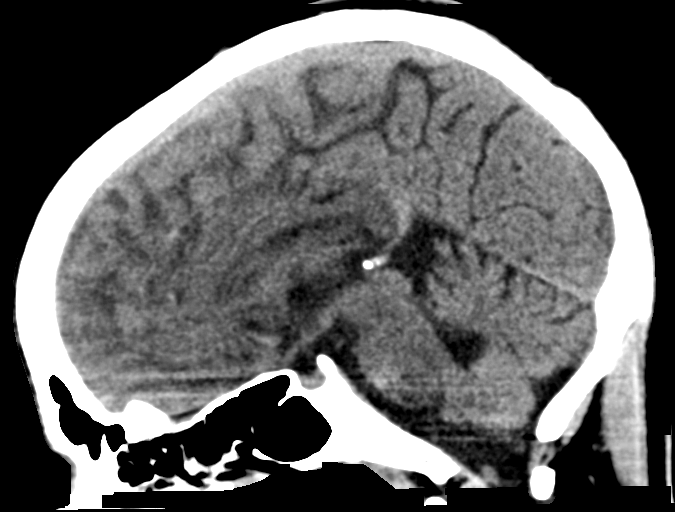
[im 35/52  brain]
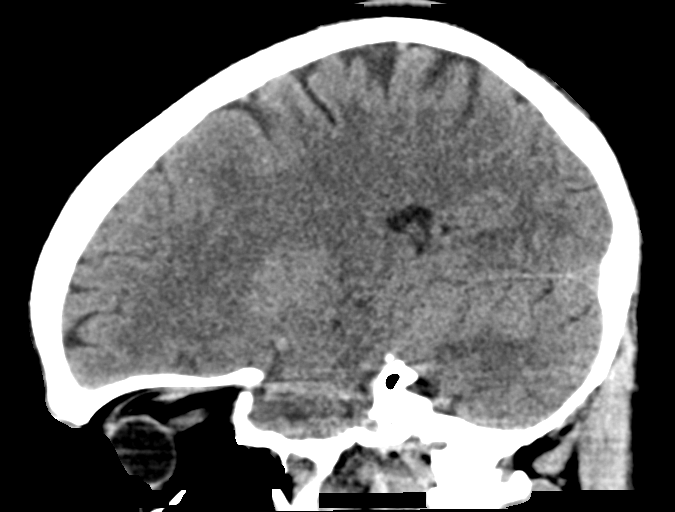

[15 of 47 positions shown; findings below may reference images not displayed]

FINDINGS: CT HEAD FINDINGS

Brain: No evidence of acute infarction, hemorrhage, hydrocephalus,
extra-axial collection or mass lesion/mass effect.

Vascular: Negative

Skull: Biparietal scalp swelling.  No calvarial fracture.

CT MAXILLOFACIAL FINDINGS

Osseous: Fracture through the anterior process of the maxilla with
mild depression at the left nasal maxillary suture. These are likely
chronic deformities based on margination and left nasal arch
depression was seen on comparison radiograph. No mandibular
dislocation or fracture.

Orbits: No evidence of injury

Sinuses: Negative for hemosinus.  Anterior nasal septum perforation.

Soft tissues: No hematoma or opaque foreign body.
IMPRESSION: 1. No evidence of intracranial injury.
2. Scalp swelling without calvarial fracture.
3. Nasal arch fractures which are favored remote, especially when
compared with 5882 radiography.
4. Perforated nasal septum.

## 2024-11-23 ENCOUNTER — Ambulatory Visit: Payer: Self-pay | Admitting: Infectious Diseases
